# Patient Record
Sex: Male | Born: 1970 | Race: White | Hispanic: No | Smoking: Never smoker
Health system: Southern US, Community
[De-identification: ages and names within clinical notes are randomized; demographics above are authoritative.]

## PROBLEM LIST (undated history)

## (undated) DIAGNOSIS — G43909 Migraine, unspecified, not intractable, without status migrainosus: Secondary | ICD-10-CM

## (undated) DIAGNOSIS — Z9889 Other specified postprocedural states: Secondary | ICD-10-CM

## (undated) DIAGNOSIS — R112 Nausea with vomiting, unspecified: Secondary | ICD-10-CM

## (undated) DIAGNOSIS — I447 Left bundle-branch block, unspecified: Secondary | ICD-10-CM

## (undated) DIAGNOSIS — R200 Anesthesia of skin: Secondary | ICD-10-CM

## (undated) DIAGNOSIS — Z8489 Family history of other specified conditions: Secondary | ICD-10-CM

## (undated) DIAGNOSIS — R011 Cardiac murmur, unspecified: Secondary | ICD-10-CM

## (undated) HISTORY — PX: BACK SURGERY: SHX140

## (undated) HISTORY — PX: REFRACTIVE SURGERY: SHX103

## (undated) HISTORY — PX: FRACTURE SURGERY: SHX138

---
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
## 1988-11-13 HISTORY — PX: CYST REMOVAL NECK: SHX6281

## 1995-11-14 HISTORY — PX: WRIST FRACTURE SURGERY: SHX121

## 2013-05-01 ENCOUNTER — Encounter (HOSPITAL_COMMUNITY): Payer: Self-pay | Admitting: Pharmacy Technician

## 2013-05-05 NOTE — H&P (Signed)
  History of Present Illness The patient is a 41 year old male who presents today for follow up of their neck. The patient is being followed for their left-sided neck pain. They are 8 week(s) out. Symptoms reported today include: pain (some posterior cervical, posterior scapula radaiting into the left upper ext. ), weakness (chest weakness ) and numbness (left upper ext.). The following medication has been used for pain control: none. The patient presents today following referral to ___ (Dr. Franky Macho neuro. with same opinion as Dr. Shon Baton. Patient is questioning levels due to couple of levels on MRI. ).    Subjective Transcription  He returns today for follow up. He has had an opportunity to review all the information that we had discussed at his two previous office visits.    Allergies No Known Drug Allergies. 03/03/2013   Social History Tobacco use. never smoker   Medication History(Lori W Lamb; 04/28/2013 2:13 PM) Aleve ( Oral) Specific dose unknown - Active.   Objective Transcription  On clinical exam his pain has improved but he still continues to have the left triceps weakness which is now about 3+ to 4-. He has neck pain which is slightly improved. No right arm symptoms. No real shoulder, elbow or wrist pain. More pain is coming down into the arm in the nerve distribution of C7 and into the pectoralis. Normal gait pattern. No shortness of breath or chest pain. Abdomen is soft, nontender, normal bowel function. Negative Babinski test. No clonus. Negative Hoffmann sign. Positive Lhermitte's sign on the left side. Positive Spurling's sign on the left side.    RADIOGRAPHS:  MRI was re-reviewed. He has degenerative disease at C5-6 with collapse of the normal disc space confirmed on his plain x-rays and he has a large disc herniation at C6-7.    Assessment & Plan Pain, Cervical (723.1)  Risks of surgery include, but are not limited to: Throat pain,swallowing  difficulty, hoarseness or change in voice, Death, stroke, paralysis, nerve root damage/injury, bleeding, blood clots, loss of bowel/bladder control, hardware failure, or malposition, spinal fluid leak, adjacent segment disease, non-union, need for further surgery, ongoing or worse pain, infection and recurrent disc herniation   Plans Transcription  We had an extensive discussion about surgical management. At this point in time I told him my only concern with addressing the clinically symptomatic disc herniation at C6-7 is that the adjacent segment has already shown signs of degeneration with Modic endplate changes and loss of normal disc height. Therefore the likelihood of adjacent segment disease occurring at the C5-6 level is somewhat higher than normal and could require further surgical intervention in the future. At this point I told him it is also reasonable to do both levels. Even though he is not having C6 radiculopathy I do think the C5-6 collapse disc can be contributing to his neck pain. Therefore the plan will be to do a two level ACDF C5-6, C6-7. Clinical diagnosis is degenerative disc disease C5-6, acute disc herniation C6-7 with C7 radiculopathy. We have reviewed the risks which include infection, bleeding, nerve damage, death, stroke, paralysis, failure to heal, need for further surgery, throat pain, swallowing difficulties, hoarseness in the voice, adjacent segment collapse, nonunion. Because this is a multilevel procedure we will use an external bone stimulator following surgery for anywhere from 6 to 12 months. All of his questions were addressed. He is present for the dictation. We will try and get this done next week.

## 2013-05-05 NOTE — Pre-Procedure Instructions (Signed)
Harold Jacobs  05/05/2013   Your procedure is scheduled on:  Wednesday, May 07, 2013  Report to [facility] (3rd floor) at  6:30 AM.  Call this number if you have problems the morning of surgery: [phone]   Remember:   Do not eat food or drink liquids after midnight.   Take these medicines the morning of surgery with A SIP OF WATER: none Stop taking Aspirin and herbal medications. Do not take any NSAIDs ie: Ibuprofen, Advil, Naproxen or any medication containing Aspirin.   Do not wear jewelry, make-up or nail polish.  Do not wear lotions, powders, or perfumes. You may wear deodorant.  Do not shave 48 hours prior to surgery. Men may shave face and neck.  Do not bring valuables to the hospital.  [facility] is not responsible  for any belongings or valuables.  Contacts, dentures or bridgework may not be worn into surgery.  Leave suitcase in the car. After surgery it may be brought to your room.  For patients admitted to the hospital, checkout time is 11:00 AM the day of discharge.   Patients discharged the day of surgery will not be allowed to drive home.  Name and phone number of your driver:   Special Instructions: Shower using CHG 2 nights before surgery and the night before surgery.  If you shower the day of surgery use CHG.  Use special wash - you have one bottle of CHG for all showers.  You should use approximately 1/3 of the bottle for each shower.   Please read over the following fact sheets that you were given: Pain Booklet, Coughing and Deep Breathing, MRSA Information and Surgical Site Infection Prevention

## 2013-05-06 ENCOUNTER — Encounter (HOSPITAL_COMMUNITY)
Admission: RE | Admit: 2013-05-06 | Discharge: 2013-05-06 | Disposition: A | Discharge status: Home / Self Care | Payer: BC Managed Care – PPO | Source: Ambulatory Visit | Attending: Orthopedic Surgery | Admitting: Orthopedic Surgery

## 2013-05-06 ENCOUNTER — Encounter (HOSPITAL_COMMUNITY): Payer: Self-pay

## 2013-05-06 HISTORY — DX: Anesthesia of skin: R20.0

## 2013-05-06 HISTORY — DX: Other specified postprocedural states: Z98.890

## 2013-05-06 HISTORY — DX: Nausea with vomiting, unspecified: R11.2

## 2013-05-06 LAB — CBC
MCH: 31 pg (ref 26.0–34.0)
MCV: 88.2 fL (ref 78.0–100.0)
Platelets: 248 10*3/uL (ref 150–400)
RDW: 12.4 % (ref 11.5–15.5)

## 2013-05-06 MED ORDER — ACETAMINOPHEN 10 MG/ML IV SOLN
1000.0000 mg | Freq: Once | INTRAVENOUS | Status: AC
  Administered 2013-05-07: 1000 mg via INTRAVENOUS
  Filled 2013-05-06: qty 100

## 2013-05-06 MED ORDER — DEXAMETHASONE SODIUM PHOSPHATE 4 MG/ML IJ SOLN
4.0000 mg | Freq: Once | INTRAMUSCULAR | Status: AC
  Administered 2013-05-07: 4 mg via INTRAVENOUS
  Filled 2013-05-06: qty 1

## 2013-05-06 MED ORDER — CEFAZOLIN SODIUM-DEXTROSE 2-3 GM-% IV SOLR
2.0000 g | INTRAVENOUS | Status: AC
  Administered 2013-05-07: 2 g via INTRAVENOUS
  Filled 2013-05-06: qty 50

## 2013-05-06 NOTE — Progress Notes (Signed)
Pt denies SOB, chest pain, and being under the care of a cardiologist. Pt made aware to bring brace on DOS.

## 2013-05-07 ENCOUNTER — Observation Stay (HOSPITAL_COMMUNITY): Payer: BC Managed Care – PPO

## 2013-05-07 ENCOUNTER — Ambulatory Visit (HOSPITAL_COMMUNITY): Payer: BC Managed Care – PPO

## 2013-05-07 ENCOUNTER — Ambulatory Visit (HOSPITAL_COMMUNITY): Payer: BC Managed Care – PPO | Admitting: Anesthesiology

## 2013-05-07 ENCOUNTER — Encounter (HOSPITAL_COMMUNITY): Payer: Self-pay | Admitting: *Deleted

## 2013-05-07 ENCOUNTER — Observation Stay (HOSPITAL_COMMUNITY)
Admission: RE | Admit: 2013-05-07 | Discharge: 2013-05-08 | Disposition: A | Discharge status: Home / Self Care | Payer: BC Managed Care – PPO | Source: Ambulatory Visit | Attending: Orthopedic Surgery | Admitting: Orthopedic Surgery

## 2013-05-07 ENCOUNTER — Encounter (HOSPITAL_COMMUNITY)
Admission: RE | Disposition: A | Discharge status: Home / Self Care | Payer: Self-pay | Source: Ambulatory Visit | Attending: Orthopedic Surgery

## 2013-05-07 ENCOUNTER — Encounter (HOSPITAL_COMMUNITY): Payer: Self-pay | Admitting: Anesthesiology

## 2013-05-07 DIAGNOSIS — Z01812 Encounter for preprocedural laboratory examination: Secondary | ICD-10-CM | POA: Insufficient documentation

## 2013-05-07 DIAGNOSIS — M5030 Other cervical disc degeneration, unspecified cervical region: Secondary | ICD-10-CM | POA: Insufficient documentation

## 2013-05-07 DIAGNOSIS — M47812 Spondylosis without myelopathy or radiculopathy, cervical region: Secondary | ICD-10-CM | POA: Insufficient documentation

## 2013-05-07 DIAGNOSIS — M5020 Other cervical disc displacement, unspecified cervical region: Principal | ICD-10-CM | POA: Insufficient documentation

## 2013-05-07 HISTORY — DX: Migraine, unspecified, not intractable, without status migrainosus: G43.909

## 2013-05-07 HISTORY — DX: Family history of other specified conditions: Z84.89

## 2013-05-07 HISTORY — PX: ANTERIOR CERVICAL DECOMP/DISCECTOMY FUSION: SHX1161

## 2013-05-07 SURGERY — ANTERIOR CERVICAL DECOMPRESSION/DISCECTOMY FUSION 2 LEVELS
Anesthesia: General | Site: Spine Cervical | Wound class: Clean

## 2013-05-07 MED ORDER — NEOSTIGMINE METHYLSULFATE 1 MG/ML IJ SOLN
INTRAMUSCULAR | Status: DC | PRN
  Administered 2013-05-07: 4 mg via INTRAVENOUS

## 2013-05-07 MED ORDER — PHENYLEPHRINE HCL 10 MG/ML IJ SOLN
INTRAMUSCULAR | Status: DC | PRN
  Administered 2013-05-07: 40 ug via INTRAVENOUS
  Administered 2013-05-07: 80 ug via INTRAVENOUS
  Administered 2013-05-07 (×3): 40 ug via INTRAVENOUS

## 2013-05-07 MED ORDER — LIDOCAINE HCL (CARDIAC) 20 MG/ML IV SOLN
INTRAVENOUS | Status: DC | PRN
  Administered 2013-05-07: 80 mg via INTRAVENOUS

## 2013-05-07 MED ORDER — SODIUM CHLORIDE 0.9 % IV SOLN
INTRAVENOUS | Status: DC | PRN
  Administered 2013-05-07: 09:00:00 via INTRAVENOUS

## 2013-05-07 MED ORDER — DEXAMETHASONE 4 MG PO TABS
4.0000 mg | ORAL_TABLET | Freq: Four times a day (QID) | ORAL | Status: DC
  Administered 2013-05-08: 4 mg via ORAL
  Filled 2013-05-07 (×7): qty 1

## 2013-05-07 MED ORDER — CEFAZOLIN SODIUM 1-5 GM-% IV SOLN
1.0000 g | Freq: Three times a day (TID) | INTRAVENOUS | Status: AC
  Administered 2013-05-07 – 2013-05-08 (×2): 1 g via INTRAVENOUS
  Filled 2013-05-07 (×3): qty 50

## 2013-05-07 MED ORDER — PHENOL 1.4 % MT LIQD
1.0000 | OROMUCOSAL | Status: DC | PRN
Start: 2013-05-07 — End: 2013-05-08

## 2013-05-07 MED ORDER — MENTHOL 3 MG MT LOZG: 1.0000 | LOZENGE | OROMUCOSAL | Status: DC | PRN

## 2013-05-07 MED ORDER — EPHEDRINE SULFATE 50 MG/ML IJ SOLN
INTRAMUSCULAR | Status: DC | PRN
  Administered 2013-05-07: 10 mg via INTRAVENOUS

## 2013-05-07 MED ORDER — ACETAMINOPHEN 10 MG/ML IV SOLN
1000.0000 mg | Freq: Four times a day (QID) | INTRAVENOUS | Status: AC
  Administered 2013-05-07 – 2013-05-08 (×4): 1000 mg via INTRAVENOUS
  Filled 2013-05-07 (×5): qty 100

## 2013-05-07 MED ORDER — POLYETHYLENE GLYCOL 3350 17 GM/SCOOP PO POWD: 17.0000 g | Freq: Every day | ORAL | Status: DC

## 2013-05-07 MED ORDER — GLYCOPYRROLATE 0.2 MG/ML IJ SOLN
INTRAMUSCULAR | Status: DC | PRN
  Administered 2013-05-07: 0.6 mg via INTRAVENOUS

## 2013-05-07 MED ORDER — ROCURONIUM BROMIDE 100 MG/10ML IV SOLN
INTRAVENOUS | Status: DC | PRN
  Administered 2013-05-07: 10 mg via INTRAVENOUS
  Administered 2013-05-07: 20 mg via INTRAVENOUS
  Administered 2013-05-07: 50 mg via INTRAVENOUS
  Administered 2013-05-07: 30 mg via INTRAVENOUS

## 2013-05-07 MED ORDER — BUPIVACAINE-EPINEPHRINE PF 0.25-1:200000 % IJ SOLN
INTRAMUSCULAR | Status: AC
  Filled 2013-05-07: qty 30

## 2013-05-07 MED ORDER — THROMBIN 20000 UNITS EX SOLR
CUTANEOUS | Status: DC | PRN
  Administered 2013-05-07: 09:00:00 via TOPICAL

## 2013-05-07 MED ORDER — OXYCODONE HCL 5 MG PO TABS: 5.0000 mg | ORAL_TABLET | Freq: Once | ORAL | Status: DC | PRN

## 2013-05-07 MED ORDER — OXYCODONE HCL 5 MG PO TABS
ORAL_TABLET | ORAL | Status: AC
  Filled 2013-05-07: qty 1

## 2013-05-07 MED ORDER — BUPIVACAINE-EPINEPHRINE 0.25% -1:200000 IJ SOLN
INTRAMUSCULAR | Status: DC | PRN
  Administered 2013-05-07: 2 mL

## 2013-05-07 MED ORDER — ONDANSETRON HCL 4 MG/2ML IJ SOLN
INTRAMUSCULAR | Status: DC | PRN
  Administered 2013-05-07: 4 mg via INTRAVENOUS

## 2013-05-07 MED ORDER — THROMBIN 20000 UNITS EX SOLR
CUTANEOUS | Status: AC
  Filled 2013-05-07: qty 20000

## 2013-05-07 MED ORDER — ARTIFICIAL TEARS OP OINT
TOPICAL_OINTMENT | OPHTHALMIC | Status: DC | PRN
  Administered 2013-05-07: 1 via OPHTHALMIC

## 2013-05-07 MED ORDER — DEXTROSE 5 % IV SOLN
500.0000 mg | Freq: Four times a day (QID) | INTRAVENOUS | Status: DC | PRN
  Administered 2013-05-07: 500 mg via INTRAVENOUS
  Filled 2013-05-07: qty 5

## 2013-05-07 MED ORDER — METHOCARBAMOL 500 MG PO TABS
ORAL_TABLET | ORAL | Status: AC
  Filled 2013-05-07: qty 1

## 2013-05-07 MED ORDER — SODIUM CHLORIDE 0.9 % IJ SOLN: 3.0000 mL | INTRAMUSCULAR | Status: DC | PRN

## 2013-05-07 MED ORDER — OXYCODONE HCL 5 MG/5ML PO SOLN: 5.0000 mg | Freq: Once | ORAL | Status: DC | PRN

## 2013-05-07 MED ORDER — DEXAMETHASONE SODIUM PHOSPHATE 4 MG/ML IJ SOLN
4.0000 mg | Freq: Four times a day (QID) | INTRAMUSCULAR | Status: DC
  Administered 2013-05-07 – 2013-05-08 (×3): 4 mg via INTRAVENOUS
  Filled 2013-05-07 (×7): qty 1

## 2013-05-07 MED ORDER — ZOLPIDEM TARTRATE 5 MG PO TABS: 5.0000 mg | ORAL_TABLET | Freq: Every evening | ORAL | Status: DC | PRN

## 2013-05-07 MED ORDER — PROMETHAZINE HCL 25 MG/ML IJ SOLN: 6.2500 mg | INTRAMUSCULAR | Status: DC | PRN

## 2013-05-07 MED ORDER — HYDROMORPHONE HCL PF 1 MG/ML IJ SOLN
0.2500 mg | INTRAMUSCULAR | Status: DC | PRN
  Administered 2013-05-07 (×2): 0.5 mg via INTRAVENOUS

## 2013-05-07 MED ORDER — FENTANYL CITRATE 0.05 MG/ML IJ SOLN
INTRAMUSCULAR | Status: DC | PRN
  Administered 2013-05-07 (×2): 100 ug via INTRAVENOUS
  Administered 2013-05-07: 50 ug via INTRAVENOUS

## 2013-05-07 MED ORDER — SODIUM CHLORIDE 0.9 % IJ SOLN
3.0000 mL | Freq: Two times a day (BID) | INTRAMUSCULAR | Status: DC
  Administered 2013-05-08: 3 mL via INTRAVENOUS

## 2013-05-07 MED ORDER — OXYCODONE HCL 5 MG PO TABS
10.0000 mg | ORAL_TABLET | ORAL | Status: DC | PRN
  Administered 2013-05-07 – 2013-05-08 (×4): 10 mg via ORAL
  Filled 2013-05-07 (×5): qty 2

## 2013-05-07 MED ORDER — METHOCARBAMOL 500 MG PO TABS: 500.0000 mg | ORAL_TABLET | Freq: Three times a day (TID) | ORAL | Status: DC | PRN

## 2013-05-07 MED ORDER — MIDAZOLAM HCL 2 MG/2ML IJ SOLN: 0.5000 mg | Freq: Once | INTRAMUSCULAR | Status: DC | PRN

## 2013-05-07 MED ORDER — METHOCARBAMOL 500 MG PO TABS: 500.0000 mg | ORAL_TABLET | Freq: Four times a day (QID) | ORAL | Status: DC | PRN

## 2013-05-07 MED ORDER — 0.9 % SODIUM CHLORIDE (POUR BTL) OPTIME
TOPICAL | Status: DC | PRN
  Administered 2013-05-07: 1000 mL

## 2013-05-07 MED ORDER — DOCUSATE SODIUM 100 MG PO CAPS: 100.0000 mg | ORAL_CAPSULE | Freq: Three times a day (TID) | ORAL | Status: DC | PRN

## 2013-05-07 MED ORDER — MORPHINE SULFATE 2 MG/ML IJ SOLN: 1.0000 mg | INTRAMUSCULAR | Status: DC | PRN

## 2013-05-07 MED ORDER — ONDANSETRON HCL 4 MG/2ML IJ SOLN
4.0000 mg | INTRAMUSCULAR | Status: DC | PRN
  Administered 2013-05-07: 4 mg via INTRAVENOUS
  Filled 2013-05-07: qty 2

## 2013-05-07 MED ORDER — HYDROMORPHONE HCL PF 1 MG/ML IJ SOLN
INTRAMUSCULAR | Status: AC
  Administered 2013-05-07: 0.5 mg via INTRAVENOUS
  Filled 2013-05-07: qty 1

## 2013-05-07 MED ORDER — SODIUM CHLORIDE 0.9 % IV SOLN: 250.0000 mL | INTRAVENOUS | Status: DC

## 2013-05-07 MED ORDER — LACTATED RINGERS IV SOLN
INTRAVENOUS | Status: DC | PRN
  Administered 2013-05-07 (×3): via INTRAVENOUS

## 2013-05-07 MED ORDER — SCOPOLAMINE 1 MG/3DAYS TD PT72: 1.0000 | MEDICATED_PATCH | Freq: Once | TRANSDERMAL | Status: DC

## 2013-05-07 MED ORDER — DEXTROSE 5 % IV SOLN
INTRAVENOUS | Status: DC | PRN
  Administered 2013-05-07: 09:00:00 via INTRAVENOUS

## 2013-05-07 MED ORDER — MIDAZOLAM HCL 5 MG/5ML IJ SOLN
INTRAMUSCULAR | Status: DC | PRN
  Administered 2013-05-07 (×2): 1 mg via INTRAVENOUS

## 2013-05-07 MED ORDER — ONDANSETRON HCL 4 MG PO TABS: 4.0000 mg | ORAL_TABLET | Freq: Three times a day (TID) | ORAL | Status: DC | PRN

## 2013-05-07 MED ORDER — OXYCODONE-ACETAMINOPHEN 10-325 MG PO TABS: 1.0000 | ORAL_TABLET | ORAL | Status: DC | PRN

## 2013-05-07 MED ORDER — HEMOSTATIC AGENTS (NO CHARGE) OPTIME
TOPICAL | Status: DC | PRN
  Administered 2013-05-07: 1 via TOPICAL

## 2013-05-07 MED ORDER — SCOPOLAMINE 1 MG/3DAYS TD PT72
MEDICATED_PATCH | TRANSDERMAL | Status: DC | PRN
  Administered 2013-05-07: 1 via TRANSDERMAL

## 2013-05-07 MED ORDER — PROPOFOL 10 MG/ML IV BOLUS
INTRAVENOUS | Status: DC | PRN
  Administered 2013-05-07: 10 mg via INTRAVENOUS
  Administered 2013-05-07: 200 mg via INTRAVENOUS
  Administered 2013-05-07: 50 mg via INTRAVENOUS

## 2013-05-07 MED ORDER — LACTATED RINGERS IV SOLN
INTRAVENOUS | Status: DC
  Administered 2013-05-07 – 2013-05-08 (×2): via INTRAVENOUS

## 2013-05-07 SURGICAL SUPPLY — 59 items
BLADE SURG ROTATE 9660 (MISCELLANEOUS) IMPLANT
BUR EGG ELITE 4.0 (BURR) IMPLANT
BUR MATCHSTICK NEURO 3.0 LAGG (BURR) IMPLANT
CANISTER SUCTION 2500CC (MISCELLANEOUS) ×2 IMPLANT
CLOTH BEACON ORANGE TIMEOUT ST (SAFETY) ×2 IMPLANT
CLSR STERI-STRIP ANTIMIC 1/2X4 (GAUZE/BANDAGES/DRESSINGS) ×2 IMPLANT
CORDS BIPOLAR (ELECTRODE) ×2 IMPLANT
COVER SURGICAL LIGHT HANDLE (MISCELLANEOUS) ×4 IMPLANT
CRADLE DONUT ADULT HEAD (MISCELLANEOUS) ×2 IMPLANT
DEVICE ENDSKLTN TC MED 8MM (Orthopedic Implant) ×1 IMPLANT
DRAPE C-ARM 42X72 X-RAY (DRAPES) ×2 IMPLANT
DRAPE POUCH INSTRU U-SHP 10X18 (DRAPES) ×2 IMPLANT
DRAPE SURG 17X23 STRL (DRAPES) ×2 IMPLANT
DRAPE U-SHAPE 47X51 STRL (DRAPES) ×2 IMPLANT
DRSG MEPILEX BORDER 4X4 (GAUZE/BANDAGES/DRESSINGS) ×2 IMPLANT
DURAPREP 26ML APPLICATOR (WOUND CARE) ×2 IMPLANT
ELECT COATED BLADE 2.86 ST (ELECTRODE) ×2 IMPLANT
ELECT REM PT RETURN 9FT ADLT (ELECTROSURGICAL) ×2
ELECTRODE REM PT RTRN 9FT ADLT (ELECTROSURGICAL) ×1 IMPLANT
ENDOSKELETON LG TC 6VBR 8MM (Orthopedic Implant) ×2 IMPLANT
ENDOSKELTON TC IMPLANT 8MM MED (Orthopedic Implant) ×2 IMPLANT
GLOVE BIOGEL PI IND STRL 8.5 (GLOVE) ×1 IMPLANT
GLOVE BIOGEL PI INDICATOR 8.5 (GLOVE) ×1
GLOVE ECLIPSE 8.5 STRL (GLOVE) ×2 IMPLANT
GOWN PREVENTION PLUS XXLARGE (GOWN DISPOSABLE) ×2 IMPLANT
GOWN STRL REIN XL XLG (GOWN DISPOSABLE) ×4 IMPLANT
KIT BASIN OR (CUSTOM PROCEDURE TRAY) ×2 IMPLANT
KIT ROOM TURNOVER OR (KITS) ×2 IMPLANT
NEEDLE SPNL 18GX3.5 QUINCKE PK (NEEDLE) ×2 IMPLANT
NS IRRIG 1000ML POUR BTL (IV SOLUTION) ×2 IMPLANT
PACK ORTHO CERVICAL (CUSTOM PROCEDURE TRAY) ×2 IMPLANT
PACK UNIVERSAL I (CUSTOM PROCEDURE TRAY) ×2 IMPLANT
PAD ARMBOARD 7.5X6 YLW CONV (MISCELLANEOUS) ×4 IMPLANT
PATTIES SURGICAL .25X.25 (GAUZE/BANDAGES/DRESSINGS) IMPLANT
PIN DISTRACTION 12MM (PIN) ×1
PIN DSTRCT 12XNS SS ACIS (PIN) ×1 IMPLANT
PIN RETAINER PRODISC 14 MM (PIN) ×2 IMPLANT
PIN TEMP SKYLINE THREADED (PIN) ×4 IMPLANT
PLATE SKYLINE 2 LEVEL 34MM (Plate) ×2 IMPLANT
PUTTY BONE DBX 5CC MIX (Putty) ×2 IMPLANT
RESTRAINT LIMB HOLDER UNIV (RESTRAINTS) ×2 IMPLANT
SCREW SKYLINE 14MM SD-VA (Screw) ×10 IMPLANT
SCREW VAR SELF TAP SKYLINE 14M (Screw) ×2 IMPLANT
SPONGE INTESTINAL PEANUT (DISPOSABLE) ×2 IMPLANT
SPONGE LAP 4X18 X RAY DECT (DISPOSABLE) ×4 IMPLANT
SPONGE SURGIFOAM ABS GEL 100 (HEMOSTASIS) ×2 IMPLANT
SURGIFLO TRUKIT (HEMOSTASIS) ×2 IMPLANT
SUT MNCRL AB 3-0 PS2 18 (SUTURE) ×2 IMPLANT
SUT SILK 2 0 (SUTURE)
SUT SILK 2-0 18XBRD TIE 12 (SUTURE) IMPLANT
SUT VIC AB 2-0 CT1 18 (SUTURE) ×2 IMPLANT
SYR BULB IRRIGATION 50ML (SYRINGE) ×2 IMPLANT
SYR CONTROL 10ML LL (SYRINGE) ×2 IMPLANT
TAPE CLOTH 4X10 WHT NS (GAUZE/BANDAGES/DRESSINGS) ×2 IMPLANT
TAPE UMBILICAL COTTON 1/8X30 (MISCELLANEOUS) ×2 IMPLANT
TOWEL OR 17X24 6PK STRL BLUE (TOWEL DISPOSABLE) ×2 IMPLANT
TOWEL OR 17X26 10 PK STRL BLUE (TOWEL DISPOSABLE) ×2 IMPLANT
TRAY FOLEY CATH 14FR (SET/KITS/TRAYS/PACK) IMPLANT
WATER STERILE IRR 1000ML POUR (IV SOLUTION) ×2 IMPLANT

## 2013-05-07 NOTE — Anesthesia Preprocedure Evaluation (Addendum)
Anesthesia Evaluation  Patient identified by MRN, date of birth, ID band Patient awake    Reviewed: Allergy & Precautions, H&P , NPO status , Patient's Chart, lab work & pertinent test results, reviewed documented beta blocker date and time   History of Anesthesia Complications (+) PONV  Airway Mallampati: I TM Distance: >3 FB Neck ROM: Full    Dental  (+) Teeth Intact and Dental Advisory Given   Pulmonary  breath sounds clear to auscultation        Cardiovascular negative cardio ROS  Rhythm:Regular Rate:Normal     Neuro/Psych    GI/Hepatic negative GI ROS, Neg liver ROS,   Endo/Other  negative endocrine ROS  Renal/GU negative Renal ROS     Musculoskeletal   Abdominal   Peds  Hematology negative hematology ROS (+)   Anesthesia Other Findings   Reproductive/Obstetrics                        Anesthesia Physical Anesthesia Plan  ASA: I  Anesthesia Plan: General   Post-op Pain Management:    Induction: Intravenous  Airway Management Planned: Oral ETT  Additional Equipment:   Intra-op Plan:   Post-operative Plan: Extubation in OR  Informed Consent: I have reviewed the patients History and Physical, chart, labs and discussed the procedure including the risks, benefits and alternatives for the proposed anesthesia with the patient or authorized representative who has indicated his/her understanding and acceptance.   Dental advisory given  Plan Discussed with: CRNA and Surgeon  Anesthesia Plan Comments:        Anesthesia Quick Evaluation

## 2013-05-07 NOTE — Preoperative (Signed)
Beta Blockers   Reason not to administer Beta Blockers:Not Applicable 

## 2013-05-07 NOTE — Transfer of Care (Signed)
Immediate Anesthesia Transfer of Care Note  Patient: Kolden Dupee  Procedure(s) Performed: Procedure(s) with comments: ANTERIOR CERVICAL DISCECTOMY FUSION   (ACDF C5-7 ) (2 LEVELS)  (N/A) - cervical five-six, six-seven anterior cervical discectomy and fusion  Patient Location: PACU  Anesthesia Type:General  Level of Consciousness: awake, alert , oriented and patient cooperative  Airway & Oxygen Therapy: Patient Spontanous Breathing and Patient connected to nasal cannula oxygen  Post-op Assessment: Report given to PACU RN and Post -op Vital signs reviewed and stable  Post vital signs: Reviewed and stable  Complications: No apparent anesthesia complications

## 2013-05-07 NOTE — Brief Op Note (Signed)
05/07/2013  11:35 AM  PATIENT:  Esmeralda Links  42 y.o. male  PRE-OPERATIVE DIAGNOSIS:  CERVICAL DISC HERNIATION   POST-OPERATIVE DIAGNOSIS:  CERVICAL DISC HERNIATION   PROCEDURE:  Procedure(s) with comments: ANTERIOR CERVICAL DISCECTOMY FUSION   (ACDF C5-7 ) (2 LEVELS)  (N/A) - cervical five-six, six-seven anterior cervical discectomy and fusion  SURGEON:  Surgeon(s) and Role:    * Venita Lick, MD - Primary  PHYSICIAN ASSISTANT:   ASSISTANTS: none   ANESTHESIA:   general  EBL:  Total I/O In: 2150 [I.V.:2150] Out: 10 [Blood:10]  BLOOD ADMINISTERED:none  DRAINS: none   LOCAL MEDICATIONS USED:  MARCAINE     SPECIMEN:  No Specimen  DISPOSITION OF SPECIMEN:  N/A  COUNTS:  YES  TOURNIQUET:  * No tourniquets in log *  DICTATION: .Other Dictation: Dictation Number [id number]  PLAN OF CARE: Admit for overnight observation  PATIENT DISPOSITION:  PACU - hemodynamically stable.

## 2013-05-07 NOTE — Plan of Care (Signed)
Problem: Consults Goal: Diagnosis - Spinal Surgery Cervical Spine Fusion ACDF C5-7

## 2013-05-07 NOTE — Evaluation (Signed)
Physical Therapy Evaluation Patient Details Name: Harold Jacobs MRN: [id number] DOB: [birth year] Today's Date: 05/07/2013 Time: 7829-5621 PT Time Calculation (min): 18 min  PT Assessment / Plan / Recommendation History of Present Illness  pt s/p anterior cervical diskectomy and fusion C5-C7  Clinical Impression  Patient is s/p ant cervical diskectomy and fusion C5-C7 POD#0.  surgery resulting in the deficits listed below (see PT Problem List). Patient will benefit from skilled PT to increase their independence and safety with mobility (while adhering to their precautions) to allow discharge home. Anticipate pt to be ready from D/C home from mobility standpoint after PT session in the morning to address stair amb.     PT Assessment  Patient needs continued PT services    Follow Up Recommendations  Supervision - Intermittent;Supervision for mobility/OOB    Does the patient have the potential to tolerate intense rehabilitation      Barriers to Discharge        Equipment Recommendations  None recommended by PT    Recommendations for Other Services OT consult   Frequency Min 5X/week    Precautions / Restrictions Precautions Precautions: Cervical Precaution Comments: pt given handout on cervical neck precautions Required Braces or Orthoses: Cervical Brace Cervical Brace: Hard collar;At all times Restrictions Weight Bearing Restrictions: No   Pertinent Vitals/Pain C/o pain with swallowing; did not rate. RN notified.       Mobility  Bed Mobility Bed Mobility: Rolling Left;Left Sidelying to Sit Rolling Left: 4: Min guard;With rail Left Sidelying to Sit: 5: Supervision Details for Bed Mobility Assistance: (A) to facilitate log rolling technique for bed mobility; verbal cues for log rolling technique; required increased time to complete due to pain  Transfers Transfers: Sit to Stand;Stand to Sit Sit to Stand: 4: Min guard;From bed Stand to Sit: 5: Supervision;To  chair/3-in-1;With armrests Details for Transfer Assistance: min guard initally with sit to stand due to dizziness; pt required mod cues to maintain cervical neck precautions  Ambulation/Gait Ambulation/Gait Assistance: 5: Supervision Ambulation Distance (Feet): 12 Feet Assistive device: Other (Comment) (IV pole) Ambulation/Gait Assistance Details: limited by dizziniess Gait Pattern: Step-through pattern;Narrow base of support Gait velocity: decr due to pain and dizziness  Stairs: No Wheelchair Mobility Wheelchair Mobility: No    Exercises     PT Diagnosis: Difficulty walking;Acute pain  PT Problem List: Decreased mobility;Pain;Decreased knowledge of precautions PT Treatment Interventions: Gait training;Stair training;Functional mobility training;Therapeutic activities;Neuromuscular re-education;Patient/family education;Balance training     PT Goals(Current goals can be found in the care plan section) Acute Rehab PT Goals Patient Stated Goal: home as soon as i can  PT Goal Formulation: With patient Time For Goal Achievement: 05/09/13 Potential to Achieve Goals: Good  Visit Information  Last PT Received On: 05/07/13 Assistance Needed: +1 History of Present Illness: pt s/p anterior cervical diskectomy and fusion C5-C7       Prior Functioning  Home Living Family/patient expects to be discharged to:: Private residence Living Arrangements: Alone Available Help at Discharge: Friend(s);Available 24 hours/day Type of Home: House Home Access: Stairs to enter [employer] of Steps: 2 Entrance Stairs-Rails: None Home Layout: Two level Alternate Level Stairs-Number of Steps: 12-15 Alternate Level Stairs-Rails: Left Home Equipment: None Prior Function Level of Independence: Independent Communication Communication: No difficulties Dominant Hand: Right    Cognition  Cognition Arousal/Alertness: Awake/alert Behavior During Therapy: WFL for tasks  assessed/performed Overall Cognitive Status: Within Functional Limits for tasks assessed    Extremity/Trunk Assessment Upper Extremity Assessment Upper Extremity Assessment: Overall WFL for  tasks assessed Lower Extremity Assessment Lower Extremity Assessment: Overall WFL for tasks assessed Cervical / Trunk Assessment Cervical / Trunk Assessment: Normal   Balance Balance Balance Assessed: Yes Static Sitting Balance Static Sitting - Balance Support: Bilateral upper extremity supported;Feet supported Static Sitting - Level of Assistance: 5: Stand by assistance Static Sitting - Comment/# of Minutes: pt tolerated sitting EOB ~4 min while dizziness subsided   End of Session PT - End of Session Equipment Utilized During Treatment: Gait belt;Cervical collar Activity Tolerance: Patient tolerated treatment well Patient left: in chair;with bed alarm set;with family/visitor present Nurse Communication: Mobility status  GP Functional Assessment Tool Used: clinical judgement Functional Limitation: Mobility: Walking and moving around Mobility: Walking and Moving Around Current Status [phone]): At least 1 percent but less than 20 percent impaired, limited or restricted Mobility: Walking and Moving Around Goal Status [phone]): 0 percent impaired, limited or restricted   Donell Sievert, [name] [phone] 05/07/2013, 3:01 PM

## 2013-05-07 NOTE — Anesthesia Procedure Notes (Signed)
Procedure Name: Intubation Date/Time: 05/07/2013 8:38 AM Performed by: Marni Griffon Pre-anesthesia Checklist: Patient identified, Emergency Drugs available, Suction available and Patient being monitored Patient Re-evaluated:Patient Re-evaluated prior to inductionOxygen Delivery Method: Circle system utilized Preoxygenation: Pre-oxygenation with 100% oxygen Intubation Type: IV induction Ventilation: Mask ventilation without difficulty Laryngoscope Size: Mac and 3 (needed the #4) Grade View: Grade II Tube type: Oral Tube size: 7.5 mm Number of attempts: 1 Airway Equipment and Method: Stylet Placement Confirmation: ETT inserted through vocal cords under direct vision,  breath sounds checked- equal and bilateral and positive ETCO2 Secured at: 21 (cm at teeth) cm Tube secured with: Tape Dental Injury: Teeth and Oropharynx as per pre-operative assessment

## 2013-05-07 NOTE — Anesthesia Postprocedure Evaluation (Signed)
  Anesthesia Post-op Note  Patient: Harold Jacobs  Procedure(s) Performed: Procedure(s) with comments: ANTERIOR CERVICAL DISCECTOMY FUSION   (ACDF C5-7 ) (2 LEVELS)  (N/A) - cervical five-six, six-seven anterior cervical discectomy and fusion  Patient Location: PACU  Anesthesia Type:General  Level of Consciousness: awake and alert   Airway and Oxygen Therapy: Patient Spontanous Breathing  Post-op Pain: mild  Post-op Assessment: Post-op Vital signs reviewed  Post-op Vital Signs: stable  Complications: No apparent anesthesia complications

## 2013-05-07 NOTE — H&P (Signed)
No change in clinical exam H+P reviewed  

## 2013-05-07 NOTE — Op Note (Signed)
NAMECHAROD, SLAWINSKI NO.:  [id number]  MEDICAL RECORD NO.:  [id number]  LOCATION:  [facility]                         FACILITY:  [facility]  PHYSICIAN:  Alvy Beal, MD    DATE OF BIRTH:  [birth year]  DATE OF PROCEDURE:  05/07/2013 DATE OF DISCHARGE:                              OPERATIVE REPORT   PREOPERATIVE DIAGNOSIS:  Cervical disk herniation, C6-7 and spondylitic cervical degenerative disk disease, C5-6.  POSTOPERATIVE DIAGNOSIS:  Cervical disk herniation, C6-7 and spondylitic cervical degenerative disk disease, C5-6.  OPERATIVE PROCEDURES:  Anterior cervical diskectomy and fusion C5-6, 6- 7.  Instrumentation system used was the [employer] anterior cervical plate 34 mm in length fixed with all 14 mm screws and Titan titanium intervertebral cages.  At C5-6, we used a medium 8 lordotic cage.  At C6- 7, we used 8 large lordotic cage.  Both were packed with DBX mix.  HISTORY:  This is a very pleasant gentleman who has been having significant neck and radicular left arm pain.  Clinical examination confirms C7 radiculopathy.  MRI confirmed a very large disk herniation posterolateral to the left C6-7 with degenerative changes at C5-6. Because of the potential risk of adjacent segment degeneration, the patient elected to proceed with a 2-level procedure.  All appropriate risks, benefits, and alternatives were discussed with the patient and consent was obtained.  INTRAOPERATIVE FINDINGS:  Large disk fragment removed at C6-7 consistent with MRI imaging studies.  OPERATIVE NOTE:  The patient was brought to the operating room and placed supine on the operating table.  After successful induction of general anesthesia and endotracheal intubation, TEDs and SCDs were applied.  Rolled towels were placed underneath the shoulder blade and restraints were placed on the wrist for intraoperative traction for imaging.  The neck was then prepped and draped in a standard  fashion. Time-out was done confirming patient, procedure, and all other pertinent important data.  Once this was completed, a transverse incision was made, centered over the C6 vertebral body.  Sharp dissection carried out down to the platysma.  The platysma was sharply incised in line.  I then sharply dissected along the medial border of the sternocleidomastoid dissecting sharply through the deep cervical and prevertebral fascia.  I then identified the omohyoid and swept and released it from its sling, and swept it medially.  A self-retaining retractor was placed and retracted the esophagus and trachea to the right and identified the carotid sheath on the left side and protected it with a finger.  I then used [employer] scheme manager to remove the remaining prevertebral fascia to completely expose the anterior longitudinal ligament.  I then placed a needle into the C5-6 disk space, took an intraoperative x-ray and confirmed that I was at the appropriate level.  Once this was done, using bipolar electrocautery, I mobilized the longus coli muscles from the midbody of C5 to the midbody of C7.  This was done bilaterally. Once I had this done, self-retaining retractor was placed underneath the longus coli muscle.  I deflated endotracheal cuff, expanded the retractors to the appropriate width and reinflated the endotracheal cuff.  I then placed distraction pins into the bodies of C6 and C7 and  gently distracted the disk space.  An annulotomy was performed with a 15 blade scalpel.  Then, using a combination of pituitary rongeurs, curettes, and Kerrison rongeurs, I removed the majority of the disk material.  Once I was down to the posterior longitudinal ligament and posterior anulus, I used a 1 mm Kerrison to debride this and remove it.  I then began removing with a nerve hook fragments of disk material in the posterior lateral left gutter.  Once I had the bulk of it removed, I then developed a  plane underneath the posterior longitudinal ligament and resected the posterior longitudinal ligament with a 1 mm Kerrison.  This allowed me to visualize in the midline the thecal sac.  I then swept out to the left and I removed 2 more large fragments of disk material.  At this point, I could freely pass my nerve hook underneath the vertebral body of C7 and C6 on that left side and I could visualize the thecal sac re- expanding to occupy the space that I had created by removing the disk fragment.  At this point, I was satisfied that I had performed a thorough diskectomy and I removed all the large fragmented disk herniation visualized on the preoperative MRI.  I rasped the endplates and then placed an 8 large Titan titanium cage packed with DBX mix and this got excellent fixation.  I then repositioned the distraction pins into the body of C5 and C6, repositioned the retractors and then using the same technique, I performed a thorough diskectomy at C5-6.  I again released the posterior anulus and used a fine nerve hook to develop a plane underneath the posterior longitudinal ligament and resected the posterior longitudinal ligament.  I removed all the majority of the uncovertebral joint spurs as well.  I rasped the endplates similar to the C6-7.  I pierced the endplates with a curette in order to have stimulated bleeding.  I then measured with trial devices and placed the 8 medium Titan titanium spacer packed with DBX mix.  At this point, I irrigated the wound copiously with normal saline.  I had both grafts properly fitted with good fixation.  I then removed the distraction pins, and the retractor and then contoured an anterior cervical plate. It was then affixed with 14-mm self-drilling screws into the bodies of C5, C6, and C7.  All screws had excellent purchase.  I then locked the screws according manufacturer's standards.  Final x-rays were taken which were satisfactory.  Hardware and  graft were in good position.  At this point, I irrigated the wound copiously with normal saline and used bipolar electrocautery to obtain hemostasis.  Once I confirmed hemostasis, I then returned the trachea and esophagus to midline, closed the platysma with interrupted 2-0 Vicryl sutures and a 3-0 Monocryl for the skin.  Steri-Strips and a dry dressing were applied.  The patient was then extubated and transferred to PACU without incident.  At the end of the case, all needle and sponge counts were correct.  There was no adverse intraoperative events.     Alvy Beal, MD     DDB/MEDQ  D:  05/07/2013  T:  05/07/2013  Job:  [id number]

## 2013-05-08 NOTE — Progress Notes (Signed)
UR COMPLETED  

## 2013-05-08 NOTE — Discharge Summary (Signed)
Patient ID: Harold Jacobs MRN: [id number] DOB/AGE: [birth year] 42 y.o.  Admit date: 05/07/2013 Discharge date: 05/08/2013  Admission Diagnoses:  Active Problems:   * No active hospital problems. *   Discharge Diagnoses:  Active Problems:   * No active hospital problems. *  status post Procedure(s): ANTERIOR CERVICAL DISCECTOMY FUSION   (ACDF C5-7 ) (2 LEVELS)   Past Medical History  Diagnosis Date  . Numbness     Hx: of left arm/hand  . PONV (postoperative nausea and vomiting)   . Family history of anesthesia complication     "Mom, PONV" (05/07/2013)  . Migraine     "none in many years" (05/07/2013)    Surgeries: Procedure(s): ANTERIOR CERVICAL DISCECTOMY FUSION   (ACDF C5-7 ) (2 LEVELS)  on 05/07/2013   Consultants:  none  Discharged Condition: Improved  Hospital Course: Harold Jacobs is an 42 y.o. male who was admitted 05/07/2013 for operative treatment of <principal problem not specified>. Patient failed conservative treatments (please see the history and physical for the specifics) and had severe unremitting pain that affects sleep, daily activities and work/hobbies. After pre-op clearance, the patient was taken to the operating room on 05/07/2013 and underwent  Procedure(s): ANTERIOR CERVICAL DISCECTOMY FUSION   (ACDF C5-7 ) (2 LEVELS) .    Patient was given perioperative antibiotics: Anti-infectives   Start     Dose/Rate Route Frequency Ordered Stop   05/07/13 1415  ceFAZolin (ANCEF) IVPB 1 g/50 mL premix     1 g 100 mL/hr over 30 Minutes Intravenous Every 8 hours 05/07/13 1347 05/08/13 0242   05/06/13 1418  ceFAZolin (ANCEF) IVPB 2 g/50 mL premix     2 g 100 mL/hr over 30 Minutes Intravenous 30 min pre-op 05/06/13 1418 05/07/13 0840       Patient was given sequential compression devices and early ambulation to prevent DVT.   Patient benefited maximally from hospital stay and there were no complications. At the time of discharge, the patient was  urinating/moving their bowels without difficulty, tolerating a regular diet, pain is controlled with oral pain medications and they have been cleared by PT/OT.   Recent vital signs: Patient Vitals for the past 24 hrs:  BP Temp Pulse Resp SpO2  05/08/13 0529 107/63 mmHg 97.9 F (36.6 C) 81 16 96 %  05/08/13 0219 109/66 mmHg 97.4 F (36.3 C) 69 18 95 %  05/07/13 2153 112/66 mmHg 97.9 F (36.6 C) 64 16 97 %  05/07/13 1826 117/62 mmHg - 62 - -  05/07/13 1330 123/70 mmHg 99 F (37.2 C) 95 16 97 %  05/07/13 1300 131/71 mmHg 98.3 F (36.8 C) 77 10 99 %  05/07/13 1245 130/71 mmHg - 80 16 100 %  05/07/13 1230 129/74 mmHg - 71 14 100 %  05/07/13 1215 129/75 mmHg - 86 11 100 %  05/07/13 1200 133/75 mmHg 98.2 F (36.8 C) 81 15 100 %     Recent laboratory studies:  Recent Labs  05/06/13 1353  WBC 7.3  HGB 15.3  HCT 43.5  PLT 248     Discharge Medications:     Medication List    STOP taking these medications       naproxen sodium 220 MG tablet  Commonly known as:  ANAPROX      TAKE these medications       docusate sodium 100 MG capsule  Commonly known as:  COLACE  Take 1 capsule (100 mg total) by mouth 3 (three) times  daily as needed for constipation.     methocarbamol 500 MG tablet  Commonly known as:  ROBAXIN  Take 1 tablet (500 mg total) by mouth 3 (three) times daily as needed.     ondansetron 4 MG tablet  Commonly known as:  ZOFRAN  Take 1 tablet (4 mg total) by mouth every 8 (eight) hours as needed for nausea.     oxyCODONE-acetaminophen 10-325 MG per tablet  Commonly known as:  PERCOCET  Take 1 tablet by mouth every 4 (four) hours as needed for pain.     polyethylene glycol powder powder  Commonly known as:  GLYCOLAX  Take 17 g by mouth daily.        Diagnostic Studies: Dg Cervical Spine 2-3 Views  05/07/2013   *RADIOLOGY REPORT*  Clinical Data:  C5-C7 anterior cervical discectomy and fusion.  CERVICAL SPINE - 2-3 VIEW,DG C-ARM 61-120 MIN  Comparison:  05/06/2013.  Findings: Technique:  C-arm fluoroscopic images were obtained intraoperatively and submitted for postoperative interpretation. Please see the performing provider's procedural report for the fluoroscopy time utilized.  AP and cross-table lateral c-arm views of the cervical spine demonstrate interval anterior discectomy and fusion from C5-C7 with an anterior plate, screws and prosthetic discs. The hardware appears well positioned. The alignment is normal. No complications are identified  IMPRESSION: No demonstrated complication following C5-C7 ACDF.   Original Report Authenticated By: Carey Bullocks, M.D.   Dg Cervical Spine 2 Or 3 Views  05/07/2013   *RADIOLOGY REPORT*  Clinical Data: Postop cervical fusion.  CERVICAL SPINE - 2-3 VIEW  Comparison: Preoperative radiographs 05/06/2013.  Findings: AP and cross-table lateral portable views of the cervical spine demonstrate interval anterior discectomy and fusion from C5- C7 with an anterior plate, screws and prosthetic discs.  The hardware appears well positioned.  The alignment is normal.  No complications are identified.  IMPRESSION: No demonstrated complication following C5-C7 ACDF.   Original Report Authenticated By: Carey Bullocks, M.D.   Dg Cervical Spine 2 Or 3 Views  05/06/2013   *RADIOLOGY REPORT*  Clinical Data: Preoperative radiograph.  Cervical spine surgery. Preoperative labeling.  CERVICAL SPINE - 2-3 VIEW  Comparison: None.  Findings: Straightening of the normal cervical lordosis.  Between 1 mm and 2 mm anterolisthesis of C4 on C5.  1 mm retrolisthesis of C5 on C6.  Collapse of the disc space at C5-C6 and to a lesser extent at C6-C7 with posterior osteophytic spurring.  Prevertebral soft tissues are normal.  IMPRESSION: Mid to lower at C5-C6 and C6-C7.   Original Report Authenticated By: Andreas Newport, M.D.   Dg C-arm [phone] Min  05/07/2013   *RADIOLOGY REPORT*  Clinical Data:  C5-C7 anterior cervical discectomy and fusion.  CERVICAL  SPINE - 2-3 VIEW,DG C-ARM 61-120 MIN  Comparison: 05/06/2013.  Findings: Technique:  C-arm fluoroscopic images were obtained intraoperatively and submitted for postoperative interpretation. Please see the performing provider's procedural report for the fluoroscopy time utilized.  AP and cross-table lateral c-arm views of the cervical spine demonstrate interval anterior discectomy and fusion from C5-C7 with an anterior plate, screws and prosthetic discs. The hardware appears well positioned. The alignment is normal. No complications are identified  IMPRESSION: No demonstrated complication following C5-C7 ACDF.   Original Report Authenticated By: Carey Bullocks, M.D.          Follow-up Information   Follow up with Alvy Beal, MD. Schedule an appointment as soon as possible for a visit in 2 weeks.   Contact information:   [address] [place] [address] [phone]       Discharge Plan:  discharge to home  Disposition: stable    Signed: Venita Lick D for Dr. Venita Lick [facility] [phone] 05/08/2013, 10:23 AM

## 2013-05-08 NOTE — Care Management Note (Signed)
    Page 1 of 1   05/08/2013     2:12:01 PM   CARE MANAGEMENT NOTE 05/08/2013  Patient:  Harold Jacobs, Harold Jacobs   Account Number:  [id number]  Date Initiated:  05/08/2013  Documentation initiated by:  Jacquelynn Cree  Subjective/Objective Assessment:   admitted postop ACDF C5-6, C6-7     Action/Plan:   plan return home  PT/OT evals-no follow up or equipment recommened   Anticipated DC Date:  05/08/2013   Anticipated DC Plan:  HOME/SELF CARE      DC Planning Services  CM consult      Choice offered to / List presented to:             Status of service:  Completed, signed off Medicare Important Message given?   (If response is "NO", the following Medicare IM given date fields will be blank) Date Medicare IM given:   Date Additional Medicare IM given:    Discharge Disposition:  HOME/SELF CARE  Per UR Regulation:  Reviewed for med. necessity/level of care/duration of stay  If discussed at Long Length of Stay Meetings, dates discussed:    Comments:

## 2013-05-08 NOTE — Progress Notes (Signed)
Physical Therapy Treatment Patient Details Name: Harold Jacobs MRN: [id number] DOB: 1971/07/07 Today's Date: 05/08/2013 Time: 8295-6213 PT Time Calculation (min): 11 min  PT Assessment / Plan / Recommendation  PT Comments   Pt is amb and transferring at independent to mod I level. Is safe from mobility standpoint to D/C home today. Will have girlfriend for 24/7 (A) upon initial D/C.   Follow Up Recommendations  Supervision - Intermittent     Does the patient have the potential to tolerate intense rehabilitation     Barriers to Discharge        Equipment Recommendations  None recommended by PT    Recommendations for Other Services    Frequency Min 5X/week   Progress towards PT Goals Progress towards PT goals: Goals met/education completed, patient discharged from PT  Plan Current plan remains appropriate    Precautions / Restrictions Precautions Precautions: Cervical Precaution Comments: pt able to recall 1/3 cervical precautions Required Braces or Orthoses: Cervical Brace Cervical Brace: Hard collar;At all times Restrictions Weight Bearing Restrictions: No   Pertinent Vitals/Pain "i feel better now then i did before surgery"     Mobility  Bed Mobility Bed Mobility: Not assessed (pt sitting in chair ) Transfers Transfers: Sit to Stand;Stand to Sit Sit to Stand: 7: Independent;From chair/3-in-1;With armrests Stand to Sit: 7: Independent;To chair/3-in-1;With armrests Details for Transfer Assistance: pt demo good technique Ambulation/Gait Ambulation/Gait Assistance: 6: Modified independent (Device/Increase time) Ambulation Distance (Feet): 400 Feet Assistive device: None Ambulation/Gait Assistance Details: demo good technique and ability to maintain cervical neck precautions Gait Pattern: Narrow base of support;Within Functional Limits Gait velocity: WFL Stairs: Yes Stairs Assistance: 6: Modified independent (Device/Increase time) Stairs Assistance Details (indicate  cue type and reason): min cues for safety; encouraged to use handrails when available  Stair Management Technique: No rails;One rail Right;Step to pattern;Forwards Number of Stairs: 5 Wheelchair Mobility Wheelchair Mobility: No    Exercises     PT Diagnosis:    PT Problem List:   PT Treatment Interventions:     PT Goals (current goals can now be found in the care plan section) Acute Rehab PT Goals Patient Stated Goal: home today   Visit Information  Last PT Received On: 05/08/13 Assistance Needed: +1 History of Present Illness: pt s/p anterior cervical diskectomy and fusion C5-C7    Subjective Data  Subjective: My only problem is my bottom hurts from sleeping in that bed. Patient Stated Goal: home today    Cognition  Cognition Arousal/Alertness: Awake/alert Behavior During Therapy: WFL for tasks assessed/performed Overall Cognitive Status: Within Functional Limits for tasks assessed    Balance  Balance Balance Assessed: No  End of Session PT - End of Session Equipment Utilized During Treatment: Cervical collar Activity Tolerance: Patient tolerated treatment well Patient left: in chair;with family/visitor present;with call bell/phone within reach Nurse Communication: Mobility status   GP Functional Assessment Tool Used: clinical judgement Functional Limitation: Mobility: Walking and moving around Mobility: Walking and Moving Around Current Status ([id number]): At least 1 percent but less than 20 percent impaired, limited or restricted Mobility: Walking and Moving Around Goal Status [phone]): 0 percent impaired, limited or restricted Mobility: Walking and Moving Around Discharge Status [phone]): 0 percent impaired, limited or restricted   Donell Sievert, [place] [phone] 05/08/2013, 8:13 AM

## 2013-05-08 NOTE — Progress Notes (Signed)
    Subjective: Procedure(s) (LRB): ANTERIOR CERVICAL DISCECTOMY FUSION   (ACDF C5-7 ) (2 LEVELS)  (N/A) 1 Day Post-Op  Patient reports pain as 2 on 0-10 scale.  Reports decreased arm pain reports incisional neck pain   Positive void Negative bowel movement Positive flatus Negative chest pain or shortness of breath  Objective: Vital signs in last 24 hours: Temp:  [97.4 F (36.3 C)-99 F (37.2 C)] 97.9 F (36.6 C) (06/26 0529) Pulse Rate:  [62-95] 81 (06/26 0529) Resp:  [10-18] 16 (06/26 0529) BP: (107-133)/(62-75) 107/63 mmHg (06/26 0529) SpO2:  [95 %-100 %] 96 % (06/26 0529)  Intake/Output from previous day: 06/25 0701 - 06/26 0700 In: 2700 [I.V.:2700] Out: 60 [Blood:60]  Labs:  Recent Labs  05/06/13 1353  WBC 7.3  RBC 4.93  HCT 43.5  PLT 248   No results found for this basename: NA, K, CL, CO2, BUN, CREATININE, GLUCOSE, CALCIUM,  in the last 72 hours No results found for this basename: LABPT, INR,  in the last 72 hours  Physical Exam: Neurologically intact ABD soft Intact pulses distally Incision: dressing C/D/I and no drainage Compartment soft  Assessment/Plan: Patient stable  xrays satisfactory Mobilization with physical therapy Encourage incentive spirometry Continue care  Up with therapy Patient doing well.  Plan on d/c to home today  Venita Lick, MD [facility] [phone]

## 2013-05-08 NOTE — Evaluation (Signed)
Occupational Therapy Evaluation Patient Details Name: Harold Jacobs MRN: [id number] DOB: [birth year] Today's Date: 05/08/2013 Time: 4098-1191 OT Time Calculation (min): 22 min  OT Assessment / Plan / Recommendation History of present illness pt s/p anterior cervical diskectomy and fusion C5-C7   Clinical Impression   Pt is recovering from ACDF.  All education completed with regard to precautions during ADL/IADL.  Pt is performing at a mod independent to independent level.  No further OT needs.  No equipment needs.    OT Assessment  Patient does not need any further OT services    Follow Up Recommendations  No OT follow up;Supervision - Intermittent    Barriers to Discharge      Equipment Recommendations  None recommended by OT    Recommendations for Other Services    Frequency       Precautions / Restrictions Precautions Precautions: Cervical Precaution Comments: instructed in cervical precautions Required Braces or Orthoses: Cervical Brace Cervical Brace: Hard collar;At all times Restrictions Weight Bearing Restrictions: No   Pertinent Vitals/Pain Premedicated, drinking cold beverage for pain with swallowing    ADL  Eating/Feeding: Independent Where Assessed - Eating/Feeding: Chair Grooming: Wash/dry hands;Independent Where Assessed - Grooming: Unsupported standing Upper Body Bathing: Modified independent Where Assessed - Upper Body Bathing: Supported standing Lower Body Bathing: Modified independent Where Assessed - Lower Body Bathing: Unsupported sitting;Supported sit to stand Upper Body Dressing: Independent Where Assessed - Upper Body Dressing: Unsupported sitting Lower Body Dressing: Modified independent Where Assessed - Lower Body Dressing: Unsupported sitting;Supported sit to stand Toilet Transfer: Modified independent Toilet Transfer Method: Sit to Barista: Comfort height toilet Toileting - Clothing Manipulation and Hygiene:  Modified independent Where Assessed - Engineer, mining and Hygiene: Standing Tub/Shower Transfer: Modified independent Tub/Shower Transfer Method: Ambulating Transfers/Ambulation Related to ADLs: independent, no device ADL Comments: instructed in techniques in order to adhere to cervical precautions for ADL and avoidance of lifting and arching during IADL.    OT Diagnosis:    OT Problem List:   OT Treatment Interventions:     OT Goals(Current goals can be found in the care plan section) Acute Rehab OT Goals Patient Stated Goal: home today   Visit Information  Last OT Received On: 05/08/13 Assistance Needed: +1 History of Present Illness: pt s/p anterior cervical diskectomy and fusion C5-C7       Prior Functioning     Home Living Family/patient expects to be discharged to:: Private residence Living Arrangements: Alone Available Help at Discharge: Friend(s);Available 24 hours/day Type of Home: House Home Access: Stairs to enter [employer] of Steps: 2 Entrance Stairs-Rails: None Home Layout: Two level Alternate Level Stairs-Number of Steps: 12-15 Alternate Level Stairs-Rails: Left Home Equipment: None Prior Function Level of Independence: Independent Communication Communication: No difficulties Dominant Hand: Right         Vision/Perception Vision - History Baseline Vision: No visual deficits Patient Visual Report: No change from baseline   Cognition  Cognition Arousal/Alertness: Awake/alert Behavior During Therapy: WFL for tasks assessed/performed Overall Cognitive Status: Within Functional Limits for tasks assessed    Extremity/Trunk Assessment Upper Extremity Assessment Upper Extremity Assessment: Overall WFL for tasks assessed (not formally assessed due to cervical precautions) Cervical / Trunk Assessment Cervical / Trunk Assessment: Normal     Mobility Bed Mobility Bed Mobility: Not assessed Transfers Transfers: Sit to  Stand;Stand to Sit Sit to Stand: 7: Independent;From chair/3-in-1;With armrests Stand to Sit: 7: Independent;To chair/3-in-1;With armrests Details for Transfer Assistance: pt demo good technique  Exercise     Balance Balance Balance Assessed: No   End of Session OT - End of Session Activity Tolerance: Patient tolerated treatment well Patient left: in chair;with call bell/phone within reach;with family/visitor present  GO Functional Assessment Tool Used: clincial judgement Functional Limitation: Self care Self Care Current Status ([id number]): At least 1 percent but less than 20 percent impaired, limited or restricted Self Care Goal Status (U0454): At least 1 percent but less than 20 percent impaired, limited or restricted Self Care Discharge Status [phone]): At least 1 percent but less than 20 percent impaired, limited or restricted   Evern Bio 05/08/2013, 9:01 AM [phone]

## 2013-05-09 ENCOUNTER — Encounter (HOSPITAL_COMMUNITY): Payer: Self-pay | Admitting: Orthopedic Surgery

## 2016-05-24 ENCOUNTER — Encounter (HOSPITAL_COMMUNITY): Payer: Self-pay | Admitting: *Deleted

## 2016-05-24 MED ORDER — CEFAZOLIN SODIUM-DEXTROSE 2-4 GM/100ML-% IV SOLN
2.0000 g | INTRAVENOUS | Status: AC
  Administered 2016-05-25: 2 g via INTRAVENOUS
  Filled 2016-05-24: qty 100

## 2016-05-24 NOTE — Progress Notes (Signed)
Pt denies SOB, chest pain, and being under the care of a cardiologist. Pt denies having a stress test, echo and cardiac cath. Pt denies having a chest x ray and EKG within the last year. Pt made aware to stop taking Aspirin, vitamins, fish oil and herbal medications. Do not take any NSAIDs ie: Ibuprofen, Advil, Naproxen, BC and Goody Powder or any medication containing Aspirin. Pt verbalized understanding of all pre-op instructions. 

## 2016-05-25 ENCOUNTER — Ambulatory Visit (HOSPITAL_COMMUNITY): Payer: BLUE CROSS/BLUE SHIELD

## 2016-05-25 ENCOUNTER — Ambulatory Visit (HOSPITAL_COMMUNITY): Payer: BLUE CROSS/BLUE SHIELD | Admitting: Anesthesiology

## 2016-05-25 ENCOUNTER — Encounter (HOSPITAL_COMMUNITY)
Admission: RE | Disposition: A | Discharge status: Home / Self Care | Payer: Self-pay | Source: Ambulatory Visit | Attending: Orthopedic Surgery

## 2016-05-25 ENCOUNTER — Observation Stay (HOSPITAL_COMMUNITY)
Admission: RE | Admit: 2016-05-25 | Discharge: 2016-05-26 | Disposition: A | Discharge status: Home / Self Care | Payer: BLUE CROSS/BLUE SHIELD | Source: Ambulatory Visit | Attending: Orthopedic Surgery | Admitting: Orthopedic Surgery

## 2016-05-25 ENCOUNTER — Encounter (HOSPITAL_COMMUNITY): Payer: Self-pay | Admitting: *Deleted

## 2016-05-25 ENCOUNTER — Other Ambulatory Visit: Payer: Self-pay | Admitting: Cardiology

## 2016-05-25 DIAGNOSIS — Z01810 Encounter for preprocedural cardiovascular examination: Secondary | ICD-10-CM | POA: Diagnosis not present

## 2016-05-25 DIAGNOSIS — X58XXXA Exposure to other specified factors, initial encounter: Secondary | ICD-10-CM | POA: Insufficient documentation

## 2016-05-25 DIAGNOSIS — I429 Cardiomyopathy, unspecified: Secondary | ICD-10-CM | POA: Diagnosis not present

## 2016-05-25 DIAGNOSIS — I447 Left bundle-branch block, unspecified: Secondary | ICD-10-CM | POA: Diagnosis not present

## 2016-05-25 DIAGNOSIS — Z419 Encounter for procedure for purposes other than remedying health state, unspecified: Secondary | ICD-10-CM

## 2016-05-25 DIAGNOSIS — I519 Heart disease, unspecified: Secondary | ICD-10-CM

## 2016-05-25 DIAGNOSIS — S82142A Displaced bicondylar fracture of left tibia, initial encounter for closed fracture: Secondary | ICD-10-CM | POA: Diagnosis present

## 2016-05-25 HISTORY — DX: Left bundle-branch block, unspecified: I44.7

## 2016-05-25 HISTORY — DX: Cardiac murmur, unspecified: R01.1

## 2016-05-25 HISTORY — PX: ORIF PROXIMAL TIBIAL PLATEAU FRACTURE: SUR953

## 2016-05-25 HISTORY — PX: ORIF TIBIA PLATEAU: SHX2132

## 2016-05-25 LAB — CREATININE, SERUM: Creatinine, Ser: 1.05 mg/dL (ref 0.61–1.24)

## 2016-05-25 LAB — ECHOCARDIOGRAM COMPLETE
E decel time: 268 msec
EERAT: 3.76
FS: 37 % (ref 28–44)
Height: 71.5 in
IVS/LV PW RATIO, ED: 1.1
LA ID, A-P, ES: 34 mm
LA diam end sys: 34 mm
LADIAMINDEX: 1.58 cm/m2
LAVOL: 35.9 mL
LAVOLA4C: 40.2 mL
LAVOLIN: 16.7 mL/m2
LDCA: 4.91 cm2
LV E/e' medial: 3.76
LV E/e'average: 3.76
LV PW d: 13.1 mm — AB (ref 0.6–1.1)
LV SIMPSON'S DISK: 57
LV TDI E'LATERAL: 12.2
LV dias vol: 118 mL (ref 62–150)
LV e' LATERAL: 12.2 cm/s
LVDIAVOLIN: 55 mL/m2
LVOT SV: 93 mL
LVOT VTI: 18.9 cm
LVOT peak vel: 98.6 cm/s
LVOTD: 25 mm
LVSYSVOL: 51 mL (ref 21–61)
LVSYSVOLIN: 24 mL/m2
MV Dec: 268
MV pk A vel: 63.4 m/s
MV pk E vel: 45.9 m/s
RV LATERAL S' VELOCITY: 13.5 cm/s
RV TAPSE: 28.1 mm
Stroke v: 67 ml
TDI e' medial: 8.7
Weight: 3200 oz

## 2016-05-25 LAB — CBC
HEMATOCRIT: 44.4 % (ref 39.0–52.0)
Hemoglobin: 14.7 g/dL (ref 13.0–17.0)
MCH: 30.4 pg (ref 26.0–34.0)
MCHC: 33.1 g/dL (ref 30.0–36.0)
MCV: 91.9 fL (ref 78.0–100.0)
Platelets: 311 10*3/uL (ref 150–400)
RBC: 4.83 MIL/uL (ref 4.22–5.81)
RDW: 12.4 % (ref 11.5–15.5)
WBC: 4.9 10*3/uL (ref 4.0–10.5)

## 2016-05-25 SURGERY — OPEN REDUCTION INTERNAL FIXATION (ORIF) TIBIAL PLATEAU
Anesthesia: Regional | Site: Knee | Laterality: Left

## 2016-05-25 MED ORDER — CHLORHEXIDINE GLUCONATE 4 % EX LIQD: 60.0000 mL | Freq: Once | CUTANEOUS | Status: DC

## 2016-05-25 MED ORDER — 0.9 % SODIUM CHLORIDE (POUR BTL) OPTIME
TOPICAL | Status: DC | PRN
  Administered 2016-05-25: 1000 mL

## 2016-05-25 MED ORDER — MIDAZOLAM HCL 2 MG/2ML IJ SOLN
INTRAMUSCULAR | Status: AC
  Administered 2016-05-25: 2 mg
  Filled 2016-05-25: qty 2

## 2016-05-25 MED ORDER — ACETAMINOPHEN 325 MG PO TABS
650.0000 mg | ORAL_TABLET | Freq: Four times a day (QID) | ORAL | Status: DC | PRN
  Administered 2016-05-25: 650 mg via ORAL
  Filled 2016-05-25: qty 2

## 2016-05-25 MED ORDER — PROMETHAZINE HCL 25 MG/ML IJ SOLN: 6.2500 mg | INTRAMUSCULAR | Status: DC | PRN

## 2016-05-25 MED ORDER — BISACODYL 5 MG PO TBEC: 5.0000 mg | DELAYED_RELEASE_TABLET | Freq: Every day | ORAL | Status: DC | PRN

## 2016-05-25 MED ORDER — PHENYLEPHRINE HCL 10 MG/ML IJ SOLN
10.0000 mg | INTRAVENOUS | Status: DC | PRN
  Administered 2016-05-25: 20 ug/min via INTRAVENOUS

## 2016-05-25 MED ORDER — MEPERIDINE HCL 25 MG/ML IJ SOLN: 6.2500 mg | INTRAMUSCULAR | Status: DC | PRN

## 2016-05-25 MED ORDER — FENTANYL CITRATE (PF) 100 MCG/2ML IJ SOLN
INTRAMUSCULAR | Status: AC
  Filled 2016-05-25: qty 2

## 2016-05-25 MED ORDER — DIPHENHYDRAMINE HCL 12.5 MG/5ML PO ELIX: 12.5000 mg | ORAL_SOLUTION | ORAL | Status: DC | PRN

## 2016-05-25 MED ORDER — ONDANSETRON HCL 4 MG/2ML IJ SOLN
4.0000 mg | Freq: Four times a day (QID) | INTRAMUSCULAR | Status: DC | PRN
  Administered 2016-05-26 (×2): 4 mg via INTRAVENOUS
  Filled 2016-05-25 (×2): qty 2

## 2016-05-25 MED ORDER — METHOCARBAMOL 1000 MG/10ML IJ SOLN
500.0000 mg | Freq: Four times a day (QID) | INTRAVENOUS | Status: DC | PRN
  Filled 2016-05-25: qty 5

## 2016-05-25 MED ORDER — MIDAZOLAM HCL 2 MG/2ML IJ SOLN
INTRAMUSCULAR | Status: DC | PRN
  Administered 2016-05-25 (×2): 2 mg via INTRAVENOUS

## 2016-05-25 MED ORDER — HYDROMORPHONE HCL 1 MG/ML IJ SOLN
1.0000 mg | INTRAMUSCULAR | Status: DC | PRN
  Administered 2016-05-26 (×3): 1 mg via INTRAVENOUS
  Filled 2016-05-25 (×3): qty 1

## 2016-05-25 MED ORDER — ONDANSETRON HCL 4 MG/2ML IJ SOLN
INTRAMUSCULAR | Status: AC
  Filled 2016-05-25: qty 2

## 2016-05-25 MED ORDER — PROPOFOL 10 MG/ML IV BOLUS
INTRAVENOUS | Status: AC
  Filled 2016-05-25: qty 20

## 2016-05-25 MED ORDER — CEFAZOLIN SODIUM-DEXTROSE 2-4 GM/100ML-% IV SOLN
2.0000 g | Freq: Four times a day (QID) | INTRAVENOUS | Status: AC
  Administered 2016-05-25 – 2016-05-26 (×3): 2 g via INTRAVENOUS
  Filled 2016-05-25 (×4): qty 100

## 2016-05-25 MED ORDER — OXYCODONE HCL 5 MG PO TABS
5.0000 mg | ORAL_TABLET | ORAL | Status: DC | PRN
  Administered 2016-05-26 (×4): 10 mg via ORAL
  Filled 2016-05-25 (×4): qty 2

## 2016-05-25 MED ORDER — MIDAZOLAM HCL 2 MG/2ML IJ SOLN
INTRAMUSCULAR | Status: AC
  Filled 2016-05-25: qty 2

## 2016-05-25 MED ORDER — ROCURONIUM BROMIDE 50 MG/5ML IV SOLN
INTRAVENOUS | Status: AC
  Filled 2016-05-25: qty 1

## 2016-05-25 MED ORDER — LIDOCAINE 2% (20 MG/ML) 5 ML SYRINGE
INTRAMUSCULAR | Status: AC
  Filled 2016-05-25: qty 5

## 2016-05-25 MED ORDER — ACETAMINOPHEN 650 MG RE SUPP: 650.0000 mg | Freq: Four times a day (QID) | RECTAL | Status: DC | PRN

## 2016-05-25 MED ORDER — METOCLOPRAMIDE HCL 5 MG PO TABS: 5.0000 mg | ORAL_TABLET | Freq: Three times a day (TID) | ORAL | Status: DC | PRN

## 2016-05-25 MED ORDER — DOCUSATE SODIUM 100 MG PO CAPS
100.0000 mg | ORAL_CAPSULE | Freq: Two times a day (BID) | ORAL | Status: DC
  Administered 2016-05-25 – 2016-05-26 (×2): 100 mg via ORAL
  Filled 2016-05-25 (×2): qty 1

## 2016-05-25 MED ORDER — LACTATED RINGERS IV SOLN: INTRAVENOUS | Status: DC

## 2016-05-25 MED ORDER — TEMAZEPAM 15 MG PO CAPS: 15.0000 mg | ORAL_CAPSULE | Freq: Every evening | ORAL | Status: DC | PRN

## 2016-05-25 MED ORDER — METHOCARBAMOL 500 MG PO TABS
500.0000 mg | ORAL_TABLET | Freq: Four times a day (QID) | ORAL | Status: DC | PRN
  Administered 2016-05-26 (×2): 500 mg via ORAL
  Filled 2016-05-25 (×2): qty 1

## 2016-05-25 MED ORDER — ONDANSETRON HCL 4 MG PO TABS: 4.0000 mg | ORAL_TABLET | Freq: Four times a day (QID) | ORAL | Status: DC | PRN

## 2016-05-25 MED ORDER — PROPOFOL 1000 MG/100ML IV EMUL
INTRAVENOUS | Status: AC
  Filled 2016-05-25: qty 200

## 2016-05-25 MED ORDER — PHENYLEPHRINE 40 MCG/ML (10ML) SYRINGE FOR IV PUSH (FOR BLOOD PRESSURE SUPPORT)
PREFILLED_SYRINGE | INTRAVENOUS | Status: AC
  Filled 2016-05-25: qty 10

## 2016-05-25 MED ORDER — PROPOFOL 10 MG/ML IV BOLUS
INTRAVENOUS | Status: DC | PRN
  Administered 2016-05-25 (×3): 10 mg via INTRAVENOUS
  Administered 2016-05-25: 70 mg via INTRAVENOUS

## 2016-05-25 MED ORDER — ENOXAPARIN SODIUM 40 MG/0.4ML ~~LOC~~ SOLN
40.0000 mg | SUBCUTANEOUS | Status: DC
  Administered 2016-05-26: 40 mg via SUBCUTANEOUS
  Filled 2016-05-25: qty 0.4

## 2016-05-25 MED ORDER — PROPOFOL 500 MG/50ML IV EMUL
INTRAVENOUS | Status: DC | PRN
  Administered 2016-05-25: 120 ug/kg/min via INTRAVENOUS

## 2016-05-25 MED ORDER — LACTATED RINGERS IV SOLN
INTRAVENOUS | Status: DC
  Administered 2016-05-25: 11:00:00 via INTRAVENOUS

## 2016-05-25 MED ORDER — HYDROMORPHONE HCL 1 MG/ML IJ SOLN: 0.2500 mg | INTRAMUSCULAR | Status: DC | PRN

## 2016-05-25 MED ORDER — METOCLOPRAMIDE HCL 5 MG/ML IJ SOLN
5.0000 mg | Freq: Three times a day (TID) | INTRAMUSCULAR | Status: DC | PRN
  Administered 2016-05-26: 10 mg via INTRAVENOUS
  Filled 2016-05-25: qty 2

## 2016-05-25 MED ORDER — FENTANYL CITRATE (PF) 250 MCG/5ML IJ SOLN
INTRAMUSCULAR | Status: AC
  Filled 2016-05-25: qty 5

## 2016-05-25 MED ORDER — FENTANYL CITRATE (PF) 100 MCG/2ML IJ SOLN
INTRAMUSCULAR | Status: AC
  Administered 2016-05-25: 100 ug
  Filled 2016-05-25: qty 2

## 2016-05-25 MED ORDER — FENTANYL CITRATE (PF) 250 MCG/5ML IJ SOLN
INTRAMUSCULAR | Status: DC | PRN
  Administered 2016-05-25: 100 ug via INTRAVENOUS
  Administered 2016-05-25: 50 ug via INTRAVENOUS
  Administered 2016-05-25: 100 ug via INTRAVENOUS

## 2016-05-25 MED ORDER — LIDOCAINE-EPINEPHRINE 2 %-1:100000 IJ SOLN
INTRAMUSCULAR | Status: DC | PRN
  Administered 2016-05-25: 20 mL via INTRADERMAL

## 2016-05-25 MED ORDER — LIDOCAINE 2% (20 MG/ML) 5 ML SYRINGE
INTRAMUSCULAR | Status: DC | PRN
  Administered 2016-05-25: 60 mg via INTRAVENOUS

## 2016-05-25 MED ORDER — BUPIVACAINE-EPINEPHRINE (PF) 0.5% -1:200000 IJ SOLN
INTRAMUSCULAR | Status: DC | PRN
  Administered 2016-05-25: 40 mL via PERINEURAL

## 2016-05-25 MED ORDER — POLYETHYLENE GLYCOL 3350 17 G PO PACK: 17.0000 g | PACK | Freq: Every day | ORAL | Status: DC | PRN

## 2016-05-25 MED ORDER — ONDANSETRON HCL 4 MG/2ML IJ SOLN
INTRAMUSCULAR | Status: DC | PRN
  Administered 2016-05-25: 4 mg via INTRAVENOUS

## 2016-05-25 SURGICAL SUPPLY — 85 items
BANDAGE ACE 6X5 VEL STRL LF (GAUZE/BANDAGES/DRESSINGS) ×3 IMPLANT
BANDAGE ELASTIC 4 VELCRO ST LF (GAUZE/BANDAGES/DRESSINGS) IMPLANT
BANDAGE ELASTIC 6 VELCRO ST LF (GAUZE/BANDAGES/DRESSINGS) IMPLANT
BIT DRILL 100X2.5XANTM LCK (BIT) ×1 IMPLANT
BIT DRILL CAL (BIT) ×1 IMPLANT
BIT DRILL SLEEVE MEASURING (BIT) ×1 IMPLANT
BIT DRL 100X2.5XANTM LCK (BIT) ×1
BLADE SURG 10 STRL SS (BLADE) ×3 IMPLANT
BLADE SURG ROTATE 9660 (MISCELLANEOUS) IMPLANT
BNDG COHESIVE 6X5 TAN STRL LF (GAUZE/BANDAGES/DRESSINGS) ×3 IMPLANT
BNDG GAUZE ELAST 4 BULKY (GAUZE/BANDAGES/DRESSINGS) ×3 IMPLANT
BONE CANC CHIPS 20CC PCAN1/4 (Bone Implant) ×3 IMPLANT
CHIPS CANC BONE 20CC PCAN1/4 (Bone Implant) ×1 IMPLANT
CLEANER TIP ELECTROSURG 2X2 (MISCELLANEOUS) ×3 IMPLANT
CLOSURE WOUND 1/2 X4 (GAUZE/BANDAGES/DRESSINGS) ×1
COVER MAYO STAND STRL (DRAPES) ×3 IMPLANT
CUFF TOURNIQUET SINGLE 34IN LL (TOURNIQUET CUFF) IMPLANT
CUFF TOURNIQUET SINGLE 44IN (TOURNIQUET CUFF) IMPLANT
DRAPE C-ARM 42X72 X-RAY (DRAPES) IMPLANT
DRAPE INCISE IOBAN 66X45 STRL (DRAPES) ×6 IMPLANT
DRAPE U-SHAPE 47X51 STRL (DRAPES) ×3 IMPLANT
DRILL BIT 2.5MM (BIT) ×2
DRILL BIT 2.7X100 214235006 DU (MISCELLANEOUS) ×3 IMPLANT
DRILL BIT CAL (BIT) ×3
DRILL SLEEVE MEASURING (BIT) ×3
DRSG ADAPTIC 3X8 NADH LF (GAUZE/BANDAGES/DRESSINGS) ×3 IMPLANT
DRSG PAD ABDOMINAL 8X10 ST (GAUZE/BANDAGES/DRESSINGS) ×6 IMPLANT
DURAPREP 26ML APPLICATOR (WOUND CARE) ×3 IMPLANT
ELECT REM PT RETURN 9FT ADLT (ELECTROSURGICAL) ×3
ELECTRODE REM PT RTRN 9FT ADLT (ELECTROSURGICAL) ×1 IMPLANT
GAUZE SPONGE 4X4 12PLY STRL (GAUZE/BANDAGES/DRESSINGS) ×3 IMPLANT
GLOVE BIO SURGEON STRL SZ7.5 (GLOVE) ×3 IMPLANT
GLOVE BIO SURGEON STRL SZ8 (GLOVE) ×3 IMPLANT
GLOVE EUDERMIC 7 POWDERFREE (GLOVE) ×3 IMPLANT
GLOVE SS BIOGEL STRL SZ 7.5 (GLOVE) ×1 IMPLANT
GLOVE SUPERSENSE BIOGEL SZ 7.5 (GLOVE) ×2
GOWN STRL REUS W/ TWL LRG LVL3 (GOWN DISPOSABLE) ×1 IMPLANT
GOWN STRL REUS W/ TWL XL LVL3 (GOWN DISPOSABLE) ×2 IMPLANT
GOWN STRL REUS W/TWL LRG LVL3 (GOWN DISPOSABLE) ×2
GOWN STRL REUS W/TWL XL LVL3 (GOWN DISPOSABLE) ×4
IMMOBILIZER KNEE 22 (SOFTGOODS) ×3 IMPLANT
K-WIRE ACE 1.6X6 (WIRE) ×6
KIT BASIN OR (CUSTOM PROCEDURE TRAY) ×3 IMPLANT
KIT ROOM TURNOVER OR (KITS) ×3 IMPLANT
KWIRE ACE 1.6X6 (WIRE) ×2 IMPLANT
MANIFOLD NEPTUNE II (INSTRUMENTS) ×3 IMPLANT
NEEDLE 22X1 1/2 (OR ONLY) (NEEDLE) ×3 IMPLANT
NEEDLE MAYO 1/2 CIRCLE (NEEDLE) ×9 IMPLANT
NS IRRIG 1000ML POUR BTL (IV SOLUTION) ×3 IMPLANT
PACK ORTHO EXTREMITY (CUSTOM PROCEDURE TRAY) ×3 IMPLANT
PAD ABD 8X10 STRL (GAUZE/BANDAGES/DRESSINGS) ×3 IMPLANT
PAD ARMBOARD 7.5X6 YLW CONV (MISCELLANEOUS) ×6 IMPLANT
PAD CAST 4YDX4 CTTN HI CHSV (CAST SUPPLIES) IMPLANT
PADDING CAST COTTON 4X4 STRL (CAST SUPPLIES)
PADDING CAST COTTON 6X4 STRL (CAST SUPPLIES) IMPLANT
PLATE LOCK 5H STD LT PROX TIB (Plate) ×3 IMPLANT
PUTTY DBM STAGRAFT PLUS 5CC (Putty) ×3 IMPLANT
SCREW 3.5X85 LOPROFILE (Screw) ×3 IMPLANT
SCREW CORT 3.5X40 (Screw) ×3 IMPLANT
SCREW CORTICAL 3.5X46MM (Screw) ×3 IMPLANT
SCREW LOCK CORT STAR 3.5X70 (Screw) ×3 IMPLANT
SCREW LP 3.5 (Screw) ×3 IMPLANT
SCREW LP 3.5X60MM (Screw) ×3 IMPLANT
SCREW LP 3.5X65MM (Screw) ×6 IMPLANT
SPONGE LAP 18X18 X RAY DECT (DISPOSABLE) ×3 IMPLANT
STAPLER VISISTAT 35W (STAPLE) IMPLANT
STOCKINETTE IMPERVIOUS LG (DRAPES) ×3 IMPLANT
STRIP CLOSURE SKIN 1/2X4 (GAUZE/BANDAGES/DRESSINGS) ×2 IMPLANT
SUCTION FRAZIER HANDLE 10FR (MISCELLANEOUS) ×2
SUCTION TUBE FRAZIER 10FR DISP (MISCELLANEOUS) ×1 IMPLANT
SUT MNCRL AB 3-0 PS2 18 (SUTURE) ×3 IMPLANT
SUT VIC AB 0 CT1 27 (SUTURE) ×2
SUT VIC AB 0 CT1 27XBRD ANBCTR (SUTURE) ×1 IMPLANT
SUT VIC AB 1 CT1 27 (SUTURE) ×4
SUT VIC AB 1 CT1 27XBRD ANBCTR (SUTURE) ×2 IMPLANT
SUT VIC AB 2-0 CT1 27 (SUTURE) ×2
SUT VIC AB 2-0 CT1 TAPERPNT 27 (SUTURE) ×1 IMPLANT
SYR 20ML ECCENTRIC (SYRINGE) ×3 IMPLANT
TOWEL OR 17X24 6PK STRL BLUE (TOWEL DISPOSABLE) ×3 IMPLANT
TOWEL OR 17X26 10 PK STRL BLUE (TOWEL DISPOSABLE) ×3 IMPLANT
TUBE CONNECTING 12'X1/4 (SUCTIONS) ×1
TUBE CONNECTING 12X1/4 (SUCTIONS) ×2 IMPLANT
WATER STERILE IRR 1000ML POUR (IV SOLUTION) ×6 IMPLANT
WRAP KNEE MAXI GEL POST OP (GAUZE/BANDAGES/DRESSINGS) ×3 IMPLANT
YANKAUER SUCT BULB TIP NO VENT (SUCTIONS) ×3 IMPLANT

## 2016-05-25 NOTE — Progress Notes (Signed)
Surgery cancelled per Dr. Earnestine Mealing due to abnormal EKG, to be transferred to ED for further cardiac testing. Dr. Rennis Chris in to see pt now. Report called to ED.

## 2016-05-25 NOTE — Progress Notes (Signed)
Bedside ECHO being performed

## 2016-05-25 NOTE — Op Note (Signed)
05/25/2016  4:30 PM  PATIENT:   Harold Jacobs  45 y.o. male  PRE-OPERATIVE DIAGNOSIS:  LEFT LATERAL TIBIAL PLATEAU  FRACTURE  POST-OPERATIVE DIAGNOSIS:  same  PROCEDURE:  ORIF left lateral tibial plateau  SURGEON:  Meryn Sarracino, Vania Rea M.D.  ASSISTANTS: Ralene Bathe, PA-C   ANESTHESIA:   Regional with femoral and popliteal block  EBL: minimal  SPECIMEN:  none  Drains: none   PATIENT DISPOSITION:  PACU - hemodynamically stable.    PLAN OF CARE: Admit for overnight observation  Dictation# done   Contact # [phone]

## 2016-05-25 NOTE — Anesthesia Postprocedure Evaluation (Signed)
Anesthesia Post Note  Patient: Harold Jacobs  Procedure(s) Performed: Procedure(s) (LRB): OPEN REDUCTION INTERNAL FIXATION (ORIF)  LEFT TIBIAL PLATEAU (Left)  Patient location during evaluation: PACU Anesthesia Type: General Level of consciousness: awake and alert Pain management: pain level controlled Vital Signs Assessment: post-procedure vital signs reviewed and stable Respiratory status: spontaneous breathing, nonlabored ventilation, respiratory function stable and patient connected to nasal cannula oxygen Cardiovascular status: blood pressure returned to baseline and stable Postop Assessment: no signs of nausea or vomiting Anesthetic complications: no    Last Vitals:  Filed Vitals:   05/25/16 1730 05/25/16 1738  BP: 114/80   Pulse: 75 77  Temp: 36.5 C   Resp: 10 14    Last Pain:  Filed Vitals:   05/25/16 1739  PainSc: 0-No pain    LLE Motor Response: No movement due to regional block (05/25/16 1730) LLE Sensation: No sensation (absent) (d/t block) (05/25/16 1730)          Harold Jacobs

## 2016-05-25 NOTE — Consult Note (Signed)
Reason for Consult: LBBB in 45 year old male  Referring Physician: Dr. Rennis Chris   PCP:  Betsey Holiday, MD  Primary Cardiologist:new Dr. Ethel Rana Clack is an 45 y.o. male.    Chief Complaint: in peri-op for surgery   HPI:  42 YOM in peri-op with plans for surgery for fx of Lt tibial plateau.   EKG with LBBB no previous EKGs.  He denies chest pain or SOB with exertion.   He is a runner and has been pushing himself more.  No syncope, no palpitations no rapid HR.  He feels great.    ECHO: Study Conclusions  - Left ventricle: The cavity size was normal. There was moderate  concentric hypertrophy. Systolic function was mildly reduced. The  estimated ejection fraction was in the range of 45% to 50%. Wall  motion was normal; there were no regional wall motion  abnormalities. - Ventricular septum: Septal motion showed moderate dyssynergy.  These changes are consistent with a left bundle branch block. - Mitral valve: There was mild regurgitation.   Past Medical History  Diagnosis Date  . Numbness     Hx: of left arm/hand  . PONV (postoperative nausea and vomiting)   . Family history of anesthesia complication     "Mom, PONV" (05/07/2013)  . Migraine     "none in many years" (05/07/2013)  . Tibial plateau fracture, left   . LBBB (left bundle branch block) 05/25/2016    Past Surgical History  Procedure Laterality Date  . Cyst removal neck Right 1990  . Wrist fracture surgery Left 1997  . Nose surgery  1984    "broke it" (05/07/2013)  . Refractive surgery  ~ 2004  . Anterior cervical decomp/discectomy fusion  05/07/2013  . Tonsillectomy  1976  . Anterior cervical decomp/discectomy fusion N/A 05/07/2013    Procedure: ANTERIOR CERVICAL DISCECTOMY FUSION   (ACDF C5-7 ) (2 LEVELS) ;  Surgeon: Venita Lick, MD;  Location: [facility] OR;  Service: Orthopedics;  Laterality: N/A;  cervical five-six, six-seven anterior cervical discectomy and fusion    Family History    Problem Relation Age of Onset  . Hypertension Mother   . Hypertension Father   . Cancer - Prostate Father   MGF with MI in 63s but + diabetes,  PGM with MI in her 35s.   Social History:  reports that he has never smoked. He has never used smokeless tobacco. He reports that he drinks about 5.4 oz of alcohol per week. He reports that he does not use illicit drugs.  Allergies:  Allergies  Allergen Reactions  . No Known Allergies     OUTPATIENT MEDICATIONS:  none   Results for orders placed or performed during the hospital encounter of 05/25/16 (from the past 48 hour(s))  CBC     Status: None   Collection Time: 05/25/16 10:42 AM  Result Value Ref Range   WBC 4.9 4.0 - 10.5 K/uL   RBC 4.83 4.22 - 5.81 MIL/uL   Hemoglobin 14.7 13.0 - 17.0 g/dL   HCT 16.1 09.6 - 04.5 %   MCV 91.9 78.0 - 100.0 fL   MCH 30.4 26.0 - 34.0 pg   MCHC 33.1 30.0 - 36.0 g/dL   RDW 40.9 81.1 - 91.4 %   Platelets 311 150 - 400 K/uL   No results found.  ROS: General:no colds or fevers, no weight changes Skin:no rashes or ulcers HEENT:no blurred vision, no congestion CV:see HPI PUL:see  HPI GI:no diarrhea constipation or melena, no indigestion GU:no hematuria, no dysuria MS:no joint pain, no claudication, + fx , no hx of lower ext edema.  Neuro:no syncope, no lightheadedness Endo:no diabetes, no thyroid disease  Blood pressure 137/81, pulse 93, temperature 98.8 F (37.1 C), temperature source Oral, resp. rate 18, height 5' 11.5" (1.816 m), weight 200 lb (90.719 kg), SpO2 97 %.  Wt Readings from Last 3 Encounters:  05/25/16 200 lb (90.719 kg)  05/06/13 194 lb 7.1 oz (88.199 kg)    PE: General:Pleasant affect, NAD Skin:Warm and dry, brisk capillary refill HEENT:normocephalic, sclera clear, mucus membranes moist Neck:supple, no JVD, no bruits  Heart:S1S2 RRR without murmur, gallup, rub or click Lungs:clear without rales, rhonchi, or wheezes QKM:MNOT, non tender, + BS, do not palpate liver spleen  or masses Ext:no lower ext edema, 2+ pedal pulses, 2+ radial pulses Neuro:alert and oriented, MAE, follows commands, + facial symmetry   Assessment/Plan LBBB - echo only reveals LBBB with septal dyssynchrony.  Dr. Rennis Golden to review.  And it is hope pt will continue on with surgery.    Discussed with Dr. Rennis Golden and plans will be to proceed with surgery.  Pt will follow up with Dr. Rennis Golden in a month to be thorough.Nada Boozer  Nurse Practitioner Certified [facility] Pager [phone] or after 5pm or weekends call [phone] 05/25/2016, 12:58 PM

## 2016-05-25 NOTE — Anesthesia Preprocedure Evaluation (Addendum)
Anesthesia Evaluation  Patient identified by MRN, date of birth, ID band Patient awake    Reviewed: Allergy & Precautions, H&P , NPO status , Patient's Chart, lab work & pertinent test results, reviewed documented beta blocker date and time   History of Anesthesia Complications (+) PONV, Family history of anesthesia reaction and history of anesthetic complications  Airway Mallampati: I  TM Distance: >3 FB Neck ROM: Full    Dental  (+) Teeth Intact, Dental Advisory Given   Pulmonary    breath sounds clear to auscultation       Cardiovascular negative cardio ROS   Rhythm:Regular Rate:Normal     Neuro/Psych  Headaches,    GI/Hepatic negative GI ROS, Neg liver ROS,   Endo/Other  negative endocrine ROS  Renal/GU negative Renal ROS     Musculoskeletal   Abdominal   Peds  Hematology negative hematology ROS (+)   Anesthesia Other Findings   Reproductive/Obstetrics                             Anesthesia Physical  Anesthesia Plan  ASA: I  Anesthesia Plan: Regional   Post-op Pain Management:    Induction: Intravenous  Airway Management Planned:   Additional Equipment:   Intra-op Plan:   Post-operative Plan:   Informed Consent: I have reviewed the patients History and Physical, chart, labs and discussed the procedure including the risks, benefits and alternatives for the proposed anesthesia with the patient or authorized representative who has indicated his/her understanding and acceptance.   Dental advisory given  Plan Discussed with: CRNA and Surgeon  Anesthesia Plan Comments:        Anesthesia Quick Evaluation

## 2016-05-25 NOTE — Progress Notes (Signed)
  Echocardiogram 2D Echocardiogram has been performed.  Harold Jacobs 05/25/2016, 1:31 PM

## 2016-05-25 NOTE — Transfer of Care (Signed)
Immediate Anesthesia Transfer of Care Note  Patient: Harold Jacobs  Procedure(s) Performed: Procedure(s): OPEN REDUCTION INTERNAL FIXATION (ORIF)  LEFT TIBIAL PLATEAU (Left)  Patient Location: PACU  Anesthesia Type:MAC combined with regional for post-op pain  Level of Consciousness: awake, alert  and oriented  Airway & Oxygen Therapy: Patient Spontanous Breathing  Post-op Assessment: Report given to RN, Post -op Vital signs reviewed and stable and block working well  Post vital signs: Reviewed and stable  Last Vitals:  Filed Vitals:   05/25/16 1420 05/25/16 1700  BP: 123/86   Pulse: 92   Temp:  36.5 C  Resp: 17     Last Pain:  Filed Vitals:   05/25/16 1701  PainSc: 2       Patients Stated Pain Goal: 2 (05/25/16 1026)  Complications: No apparent anesthesia complications

## 2016-05-25 NOTE — Anesthesia Procedure Notes (Addendum)
Anesthesia Regional Block:  Femoral nerve block  Pre-Anesthetic Checklist: ,, timeout performed, Correct Patient, Correct Site, Correct Laterality, Correct Procedure, Correct Position, site marked, Risks and benefits discussed, Surgical consent,  Pre-op evaluation,  Post-op pain management  Laterality: Left  Prep: chloraprep       Needles:  Injection technique: Single-shot  Needle Type: Stimulator Needle - 80     Needle Length: 9cm 9 cm Needle Gauge: 22 and 22 G  Needle insertion depth: 6 cm   Additional Needles:  Procedures: ultrasound guided (picture in chart) and nerve stimulator Femoral nerve block  Nerve Stimulator or Paresthesia:  Response: Patellar snap, 0.5 mA,   Additional Responses:   Narrative:  Injection made incrementally with aspirations every 5 mL.  Performed by: Personally  Anesthesiologist: Lewie Loron  Additional Notes: BP cuff, EKG monitors applied. Sedation begun. Femoral artery palpated for location of nerve. After nerve location verified with U/S, anesthetic injected incrementally, slowly, and after negative aspirations under direct u/s guidance. Good perineural spread. Patient tolerated well.   Anesthesia Regional Block:  Popliteal block  Pre-Anesthetic Checklist: ,, timeout performed, Correct Patient, Correct Site, Correct Laterality, Correct Procedure, Correct Position, site marked, Risks and benefits discussed, Surgical consent,  Pre-op evaluation,  Post-op pain management  Laterality: Left  Prep: chloraprep       Needles:  Injection technique: Single-shot  Needle Type: Stimiplex     Needle Length: 10cm 10 cm Needle Gauge: 21 and 21 G    Additional Needles:  Procedures: ultrasound guided (picture in chart) and nerve stimulator  Motor weakness within 5 minutes. Popliteal block  Nerve Stimulator or Paresthesia:  Response: Plantar flexion/toe flexion and foot eversion , 0.5 mA,   Additional Responses:   Narrative:   Injection made incrementally with aspirations every 5 mL.  Performed by: Personally  Anesthesiologist: Lewie Loron  Additional Notes: Nerve located and needle positioned with direct ultrasound guidance. Good perineural spread. Patient tolerated well.   Procedure Name: MAC Date/Time: 05/25/2016 2:37 PM Performed by: Orvilla Fus A Pre-anesthesia Checklist: Patient identified, Emergency Drugs available, Suction available and Patient being monitored Oxygen Delivery Method: Simple face mask Placement Confirmation: positive ETCO2 Dental Injury: Teeth and Oropharynx as per pre-operative assessment

## 2016-05-25 NOTE — Progress Notes (Signed)
CRNA at bedside preparing to take pt back to OR. 

## 2016-05-25 NOTE — H&P (Signed)
Harold Jacobs    Chief Complaint: LEFT LATERAL TIBIAL PLATEAU  FRACTURE HPI: The patient is a 45 y.o. male s/p wake boarding injury to left knee with valgus load and depressed lateral tibial plateau fracture.  Past Medical History  Diagnosis Date  . Numbness     Hx: of left arm/hand  . PONV (postoperative nausea and vomiting)   . Family history of anesthesia complication     "Mom, PONV" (05/07/2013)  . Migraine     "none in many years" (05/07/2013)  . Tibial plateau fracture, left   . LBBB (left bundle branch block) 05/25/2016    Past Surgical History  Procedure Laterality Date  . Cyst removal neck Right 1990  . Wrist fracture surgery Left 1997  . Nose surgery  1984    "broke it" (05/07/2013)  . Refractive surgery  ~ 2004  . Anterior cervical decomp/discectomy fusion  05/07/2013  . Tonsillectomy  1976  . Anterior cervical decomp/discectomy fusion N/A 05/07/2013    Procedure: ANTERIOR CERVICAL DISCECTOMY FUSION   (ACDF C5-7 ) (2 LEVELS) ;  Surgeon: Venita Lick, MD;  Location: [facility] OR;  Service: Orthopedics;  Laterality: N/A;  cervical five-six, six-seven anterior cervical discectomy and fusion    Family History  Problem Relation Age of Onset  . Hypertension Mother   . Hypertension Father   . Cancer - Prostate Father     Social History:  reports that he has never smoked. He has never used smokeless tobacco. He reports that he drinks about 5.4 oz of alcohol per week. He reports that he does not use illicit drugs.   No prescriptions prior to admission     Physical Exam: left knee with tense effusion, diffuse tenderness. N/v intact distally  Vitals  Temp:  [98.8 F (37.1 C)] 98.8 F (37.1 C) (07/13 1026) Pulse Rate:  [93] 93 (07/13 1026) Resp:  [18] 18 (07/13 1026) BP: (137)/(81) 137/81 mmHg (07/13 1026) SpO2:  [97 %] 97 % (07/13 1026) Weight:  [90.719 kg (200 lb)] 90.719 kg (200 lb) (07/13 1026)  Assessment/Plan  Impression: LEFT LATERAL TIBIAL PLATEAU   FRACTURE  Plan of Action: Procedure(s): OPEN REDUCTION INTERNAL FIXATION (ORIF)  LEFT TIBIAL PLATEAU  Harold Jacobs M Saumya Hukill 05/25/2016, 1:47 PM Contact # [phone]

## 2016-05-25 NOTE — Progress Notes (Signed)
Per Dr. Rennis Chris, spoke with cardiology, pt to remain in Short Stay for ECHO and to be seen by cardiologist. Dr. Earnestine Mealing called and informed by this nurse.

## 2016-05-25 NOTE — Progress Notes (Addendum)
Per Dr. Rennis Chris, okay to move forward with surgery as pt has been cleared by cardiology. Dr. Renold Don informed by Dr. Rennis Chris.

## 2016-05-26 ENCOUNTER — Encounter (HOSPITAL_COMMUNITY): Payer: Self-pay | Admitting: Orthopedic Surgery

## 2016-05-26 ENCOUNTER — Other Ambulatory Visit (HOSPITAL_COMMUNITY): Payer: BLUE CROSS/BLUE SHIELD

## 2016-05-26 DIAGNOSIS — S82142A Displaced bicondylar fracture of left tibia, initial encounter for closed fracture: Secondary | ICD-10-CM | POA: Diagnosis not present

## 2016-05-26 MED ORDER — OXYCODONE-ACETAMINOPHEN 5-325 MG PO TABS: 1.0000 | ORAL_TABLET | ORAL | Status: DC | PRN

## 2016-05-26 MED ORDER — ONDANSETRON HCL 4 MG PO TABS: 4.0000 mg | ORAL_TABLET | Freq: Three times a day (TID) | ORAL | Status: DC | PRN

## 2016-05-26 MED ORDER — KETOROLAC TROMETHAMINE 30 MG/ML IJ SOLN
30.0000 mg | Freq: Four times a day (QID) | INTRAMUSCULAR | Status: DC | PRN
  Administered 2016-05-26 (×2): 30 mg via INTRAVENOUS
  Filled 2016-05-26 (×2): qty 1

## 2016-05-26 MED ORDER — DIAZEPAM 5 MG PO TABS: 2.5000 mg | ORAL_TABLET | Freq: Four times a day (QID) | ORAL | Status: DC | PRN

## 2016-05-26 MED ORDER — HYDROMORPHONE HCL 2 MG PO TABS: 2.0000 mg | ORAL_TABLET | ORAL | Status: DC | PRN

## 2016-05-26 NOTE — Progress Notes (Signed)
Shift and discharge notes: patient is alert, awake, resting in the bed, no acute distress noted at present time, had episodes of nausea and vomited once yellow color emesis with food particles, was medicated with Zofran 4 mg IVP with mild relief, patient is being discharged to home today, discharge instructions given to patient with verbalization of understanding and returned demonstration, patient is now ready to be discharged and waiting for his ride.

## 2016-05-26 NOTE — Discharge Summary (Signed)
PATIENT ID:      Harold Jacobs  MRN:     [id number] DOB/AGE:    March 12, 1971 / 45 y.o.     DISCHARGE SUMMARY  ADMISSION DATE:    05/25/2016 DISCHARGE DATE:    ADMISSION DIAGNOSIS: LEFT LATERAL TIBIAL PLATEAU  FRACTURE Past Medical History  Diagnosis Date  . Numbness     Hx: of left arm/hand "preop from my neck"  . PONV (postoperative nausea and vomiting)   . Family history of anesthesia complication     "Mom, PONV" (05/07/2013)  . LBBB (left bundle branch block) 05/25/2016  . Heart murmur   . Migraine     "none in many years" (05/25/2016)    DISCHARGE DIAGNOSIS:   Active Problems:   LBBB (left bundle branch block)   Tibial plateau fracture, left   PROCEDURE: Procedure(s): OPEN REDUCTION INTERNAL FIXATION (ORIF)  LEFT TIBIAL PLATEAU on 05/25/2016  CONSULTS:     HISTORY:  See H&P in chart.  HOSPITAL COURSE:  Harold Jacobs is a 45 y.o. admitted on 05/25/2016 with a diagnosis of LEFT LATERAL TIBIAL PLATEAU  FRACTURE.  They were brought to the operating room on 05/25/2016 and underwent Procedure(s): OPEN REDUCTION INTERNAL FIXATION (ORIF)  LEFT TIBIAL PLATEAU.    They were given perioperative antibiotics: Anti-infectives    Start     Dose/Rate Route Frequency Ordered Stop   05/25/16 2130  ceFAZolin (ANCEF) IVPB 2g/100 mL premix     2 g 200 mL/hr over 30 Minutes Intravenous Every 6 hours 05/25/16 1745 05/26/16 1529   05/25/16 1200  ceFAZolin (ANCEF) IVPB 2g/100 mL premix     2 g 200 mL/hr over 30 Minutes Intravenous To ShortStay Surgical 05/24/16 1027 05/25/16 1437    . Preoperatively patient was found to have a LBBB on EKG requiring a cardiology consult and bedside echo. He was felt safe to proceed with surgery and it was performed under regional with femoral and popliteal block. Please see notes for details  Patient underwent the above named procedure and tolerated it well. He had a rough night and was having some mild nausea the next am. The following day they were  hemodynamically stable and pain was better controlled on oral analgesics. They were neurovascularly intact to the operative extremity. PT was ordered and worked with patient per protocol. They were medically and orthopaedically stable for discharge on day 1.    DIAGNOSTIC STUDIES:  RECENT RADIOGRAPHIC STUDIES :  Dg Knee 1-2 Views Left  05/25/2016  CLINICAL DATA:  ORIF left tibial plateau fracture EXAM: LEFT KNEE - 1-2 VIEW COMPARISON:  None FLUOROSCOPY TIME:  15 seconds FINDINGS: Left lateral tibial plateau fracture transfix with a lateral sideplate and multiple interlocking screws. No hardware failure or complication. Near anatomic alignment. IMPRESSION: Interval ORIF left tibial plateau fracture. Electronically Signed   By: Elige Ko   On: 05/25/2016 16:38   Dg C-arm 1-60 Min  05/25/2016  CLINICAL DATA:  ORIF left tibial plateau fracture EXAM: LEFT KNEE - 1-2 VIEW COMPARISON:  None FLUOROSCOPY TIME:  15 seconds FINDINGS: Left lateral tibial plateau fracture transfix with a lateral sideplate and multiple interlocking screws. No hardware failure or complication. Near anatomic alignment. IMPRESSION: Interval ORIF left tibial plateau fracture. Electronically Signed   By: Elige Ko   On: 05/25/2016 16:38    RECENT VITAL SIGNS:  Patient Vitals for the past 24 hrs:  BP Temp Temp src Pulse Resp SpO2 Height Weight  05/26/16 0408 116/61 mmHg 98.1 F (36.7 C) Oral  74 19 99 % - -  05/25/16 2345 113/62 mmHg 98.6 F (37 C) Oral 82 18 99 % - -  05/25/16 2045 (!) 118/59 mmHg 97.3 F (36.3 C) Oral 82 16 98 % - -  05/25/16 1738 - - - 77 14 98 % - -  05/25/16 1730 114/80 mmHg 97.7 F (36.5 C) - 75 10 100 % - -  05/25/16 1715 112/61 mmHg - - 86 10 100 % - -  05/25/16 1705 - - - 94 13 98 % - -  05/25/16 1702 111/70 mmHg - - 96 (!) 9 98 % - -  05/25/16 1701 - - - 99 - 93 % - -  05/25/16 1700 - 97.7 F (36.5 C) - - - - - -  05/25/16 1420 123/86 mmHg - - 92 17 100 % - -  05/25/16 1415 121/79 mmHg - -  94 18 100 % - -  05/25/16 1410 121/74 mmHg - - 93 16 100 % - -  05/25/16 1405 131/90 mmHg - - 90 18 100 % - -  05/25/16 1026 137/81 mmHg 98.8 F (37.1 C) Oral 93 18 97 % 5' 11.5" (1.816 m) 90.719 kg (200 lb)  .  RECENT EKG RESULTS:    Orders placed or performed during the hospital encounter of 05/25/16  . EKG 12-Lead  . EKG 12-Lead    DISCHARGE INSTRUCTIONS:  Discharge Instructions    Discontinue IV    Complete by:  As directed   After nausea has resolved           DISCHARGE MEDICATIONS:     Medication List    TAKE these medications        diazepam 5 MG tablet  Commonly known as:  VALIUM  Take 0.5-1 tablets (2.5-5 mg total) by mouth every 6 (six) hours as needed for muscle spasms or sedation.     HYDROmorphone 2 MG tablet  Commonly known as:  DILAUDID  Take 1-2 tablets (2-4 mg total) by mouth every 4 (four) hours as needed for severe pain (not controlled with percocet).     ondansetron 4 MG tablet  Commonly known as:  ZOFRAN  Take 1 tablet (4 mg total) by mouth every 8 (eight) hours as needed for nausea or vomiting.     oxyCODONE-acetaminophen 5-325 MG tablet  Commonly known as:  PERCOCET  Take 1-2 tablets by mouth every 4 (four) hours as needed.        FOLLOW UP VISIT:       Follow-up Information    Follow up with Chrystie Nose, MD On 06/29/2016.   Specialty:  Cardiology   Why:  the Cardilogist  at 8:30 AM  for follow up.     Contact information:   [address] [place] [address] [phone]       Follow up with [facility] On 06/20/2016.   Specialty:  Cardiology   Why:  at 7:45 AM for a stress test.  You will not walk for this test due to the EKG abnormality.     Contact information:   [address], [address] [place] [address] [phone]      Follow up with Vania Rea SUPPLE, MD.   Specialty:  Orthopedic Surgery   Why:  call to be seen in 10-14 days   Contact information:   [address] [place] [address] [phone]       DISCHARGE TO:  home   DISPOSITION: Good  DISCHARGE CONDITION:  Good   Reganne Messerschmidt for Dr. Francena Hanly 05/26/2016, 8:28 AM

## 2016-05-26 NOTE — Discharge Instructions (Signed)
For stress test July 8th do not eat or drink after midnight for the test.  This is [employer], we will place an IV and give medication and do a scan looking at resting phase of your heart.  Then you will have stress portion with a medicine that dilates your blood vessels as if you were walking on a treadmill.  You may have mild discomfort of chest pain, shortness of breath, or headache, but this clears in 3-4 minutes.  Then we scan you again comparing the rest with the stress looking for abnormalities.  The day before the test do not drink caffeine.  Do not wear cologne or strong deodorants to the test.  This is done at the [address] office.    You will follow up with Dr. Rennis Golden at the [facility] office.          Continue to ice and remain non weight bearing to left leg. Work on range of motion exercises as the therapist instructed. You can remove ace wrap and dressings on day 3 and shower. You can then keep an ace or light dressing over incisions. There is dermabond over the incision, leave that in place. Take OTC stool softener while on pain meds to prevent constipation  TAKE 1 Aspirin daily for 3 weeks!!!!

## 2016-05-26 NOTE — Care Management Note (Signed)
Case Management Note  Patient Details  Name: Harold Jacobs MRN: [id number] Date of Birth: 08-22-1971  Subjective/Objective:                    Action/Plan: Pt discharging home with self care. No further needs per CM.   Expected Discharge Date:                  Expected Discharge Plan:  Home/Self Care  In-House Referral:     Discharge planning Services     Post Acute Care Choice:    Choice offered to:     DME Arranged:    DME Agency:     HH Arranged:    HH Agency:     Status of Service:  Completed, signed off  If discussed at [employer] of Stay Meetings, dates discussed:    Additional Comments:  Kermit Balo, RN 05/26/2016, 2:56 PM

## 2016-05-26 NOTE — Evaluation (Signed)
Physical Therapy Evaluation Patient Details Name: Harold Jacobs MRN: [id number] DOB: 11/15/1970 Today's Date: 05/26/2016   History of Present Illness  45 y.o. male s/p wake boarding injury to left knee with  depressed lateral tibial plateau fracture which occured in late June/early July. Pt now s/p ORIF Lt lateral tibial plateau. Pt reports managing at home independently while awaiting surgery. PMH: anterior cervical surgery, LBBB, heart murmur, migraines.   Clinical Impression  Patient is s/p above surgery resulting in functional limitations due to the deficits listed below (see PT Problem List). Pt able to ambulate 75 feet and go up/down 2 stairs both with crutches.  Patient will benefit from skilled PT to increase their independence and safety with mobility to allow discharge to home with family and friends to assist. Pt denies any questions or concerns following session.        Follow Up Recommendations No PT follow up;Supervision - Intermittent    Equipment Recommendations  None recommended by PT    Recommendations for Other Services       Precautions / Restrictions Precautions Precautions: Fall;Knee Precaution Comments: gentle ROM Required Braces or Orthoses: Knee Immobilizer - Left Restrictions Weight Bearing Restrictions: Yes LLE Weight Bearing: Non weight bearing      Mobility  Bed Mobility Overal bed mobility: Independent             General bed mobility comments: supine<>sit  Transfers Overall transfer level: Needs assistance Equipment used: Crutches Transfers: Sit to/from Stand Sit to Stand: Supervision         General transfer comment: using crutches, reminder for placement of crutches with transfers.   Ambulation/Gait Ambulation/Gait assistance: Supervision Ambulation Distance (Feet): 75 Feet Assistive device: Crutches Gait Pattern/deviations:  (swing-to pattern) Gait velocity: decreased   General Gait Details: stable technique demonstrated,  no loss of balance. Consistent with NWB.  Stairs Stairs: Yes Stairs assistance: Min guard Stair Management: One rail Left;Step to pattern;Forwards;With crutches Number of Stairs: 2 General stair comments: Using rail and crutches to go up/down steps. Pt declines ambulating any more stairs or attempting without rails. Pt states that he feels safe with the stairs and his ability to enter his home.   Wheelchair Mobility    Modified Rankin (Stroke Patients Only)       Balance Overall balance assessment: Needs assistance Sitting-balance support: No upper extremity supported Sitting balance-Leahy Scale: Normal     Standing balance support: Single extremity supported Standing balance-Leahy Scale: Fair Standing balance comment: static                             Pertinent Vitals/Pain Pain Assessment: 0-10 Pain Score: 2  Pain Location: Lt leg Pain Descriptors / Indicators: Aching Pain Intervention(s): Limited activity within patient's tolerance;Monitored during session;Ice applied    Home Living Family/patient expects to be discharged to:: Private residence Living Arrangements: Spouse/significant other Available Help at Discharge: Family;Available 24 hours/day (parents will be staying with him) Type of Home: House Home Access: Stairs to enter Entrance Stairs-Rails: None Entrance Stairs-Number of Steps: 2 Home Layout: Two level Home Equipment: Crutches Additional Comments: lives with girlfreind who works. Describes that he has been bumping up/down the stairs at home. Planning to stay on the main level and sleep in the recliner initially.      Prior Function Level of Independence: Independent with assistive device(s)         Comments: using crutches, bumpin up/down stairs     Hand Dominance  Extremity/Trunk Assessment   Upper Extremity Assessment: Overall WFL for tasks assessed           Lower Extremity Assessment: LLE deficits/detail   LLE  Deficits / Details: assist with raising LLE due to pain  Cervical / Trunk Assessment: Normal  Communication   Communication: No difficulties  Cognition Arousal/Alertness: Awake/alert Behavior During Therapy: WFL for tasks assessed/performed Overall Cognitive Status: Within Functional Limits for tasks assessed                      General Comments      Exercises Other Exercises Other Exercises: gentle supported knee flexion/extension, pt educated on techinque. Other Exercises: ankle pumps Other Exercises: lt ankle dorsiflexion stretch      Assessment/Plan    PT Assessment Patient needs continued PT services  PT Diagnosis Difficulty walking   PT Problem List Decreased strength;Decreased range of motion;Decreased activity tolerance;Decreased balance;Decreased mobility  PT Treatment Interventions DME instruction;Gait training;Stair training;Functional mobility training;Therapeutic activities;Therapeutic exercise;Patient/family education   PT Goals (Current goals can be found in the Care Plan section) Acute Rehab PT Goals Patient Stated Goal: go home PT Goal Formulation: With patient Time For Goal Achievement: 06/02/16 Potential to Achieve Goals: Good    Frequency Min 5X/week   Barriers to discharge        Co-evaluation               End of Session Equipment Utilized During Treatment: Gait belt;Left knee immobilizer Activity Tolerance: Patient tolerated treatment well (reports nausea during session) Patient left: in bed;with call bell/phone within reach Nurse Communication: Mobility status         Time: 1610-9604 PT Time Calculation (min) (ACUTE ONLY): 51 min   Charges:   PT Evaluation $PT Eval Moderate Complexity: 1 Procedure PT Treatments $Gait Training: 8-22 mins $Therapeutic Exercise: 8-22 mins   PT G Codes:        Christiane Ha, PT, CSCS Pager [phone] Office [phone]  05/26/2016, 2:43 PM

## 2016-05-26 NOTE — Op Note (Signed)
NAMECIMARRON, PUFAHL NO.:  [id number]  MEDICAL RECORD NO.:  [id number]  LOCATION:  5N15C                        FACILITY:  [facility]  PHYSICIAN:  Vania Rea. Tenzin Edelman, M.D.  DATE OF BIRTH:  Apr 17, 1971  DATE OF PROCEDURE:  05/25/2016 DATE OF DISCHARGE:                              OPERATIVE REPORT   PREOPERATIVE DIAGNOSIS:  Impacted displaced left lateral tibial plateau fracture.  POSTOPERATIVE DIAGNOSIS:  Impacted displaced left lateral tibial plateau fracture.  PROCEDURE:  Open reduction and internal fixation of left lateral tibial plateau fracture with allograft bone grafting of the metaphyseal defect.  SURGEON:  Francena Hanly, M.D.  ASSISTANT:  French Ana A. Shuford, PA-C.  ANESTHESIA:  Regional with IV sedation.  ESTIMATED BLOOD LOSS:  Minimal.  DRAINS:  None.  TOURNIQUET TIME:  1 hour and 15 minutes.  HISTORY:  Mr. Defina is a 45 year old gentleman who injured his left knee while wakeboarding sustaining what appears to have been a valgus loading injury with immediate complaints of severe pain and swelling. He was evaluated in our office where a large lipohemarthrosis was evacuated.  Plain radiographs showed questionable injury of lateral tibial plateau.  Subsequent MRI scan confirmed that there was a significant impaction defect at the anterolateral articular surface with some comminution and displacement.  The degree of depression was significant with cortical violation and given the degree of impaction deformity and potential for further deformity of the articular surface, I have discussed with Mr. Chadick treatment options, potential risks versus benefits thereof.  Possible surgical complications of bleeding, infection, neurovascular injury, DVT, PE, persistent pain, loss of motion, anesthetic complication, possible need for additional surgery, and potential for posttraumatic arthritis were all reviewed.  It is our feeling that by improving the  overall alignment of the articular surface that we could potentially lower his risk for posttraumatic arthritis. He understands and accepts and agrees with plan for ORIF.  PROCEDURE IN DETAIL:  After undergoing extensive preop evaluation which did include a Cardiology evaluation, he had a preoperative EKG which showed evidence for left bundle-branch block.  Subsequent echocardiogram was performed, which overall was felt to show relatively normal cardiac function.  Cardiologist did suggest followup postoperatively.  He does not feel that there is any reason to postpone the surgery for further cardiac workup.  We did utilize strictly a regional anesthetic in recognition of the left bundle-branch block.  For this reason, then in the holding area, the anesthesiologist placed a combination of femoral nerve block and popliteal block.  The patient was then brought to the operating room, placed supine on the operative table and, did receive some supplemental IV sedation.  A tourniquet was then applied to the left thigh.  The left leg was sterilely prepped and draped in standard fashion.  Time-out was called.  The left lower extremity was exsanguinated with the tourniquet inflated to 350 mmHg.  I made an anterolateral hockey-stick incision along the tibial crest and then traversing posteriorly along the margin at the lateral tibial plateau. Skin was elevated as a single flap and then dissection carried deeply with the deep fascia divided in line with skin incision and elevated the anterior compartment musculature away from the proximal lateral tibial  cortex and then divided posteriorly and completed our dissection dividing the iliotibial band and then deeper tissues and reflecting the tissues on a posterior pedicle and then divided through the meniscotibial ligaments for later repair.  Once this exposure was completed, I had direct visualization of the fracture site and could also easily see the  articular surface which did indeed show a significant depression of the anterolateral portion of the lateral tibial plateau.  I then manipulated the fracture site and was able to gain access into the metaphyseal region where the bone had been significantly depressed and used a series of bone tamps to elevate the articular surfaces and compact the cancellous bone in the region, recreating the normal contour of the lateral tibial plateau.  I then utilized 20 mL of cortical cancellous chips as well as a vial of demineralized bone matrix and mixed this into a slurry and ended up using approximately 15 mL of this construct and impacted this into the metaphyseal region to help this to further prop up and re-contour the normal concavity of the lateral tibial plateau.  Then I selected the appropriate plate from the [employer] tibial plate tray and this was provisionally placed over the lateral aspect of the proximal tibia.  We then used the "[employer]" to allow [place] to create compression through the plate and compressing the fracture site and placed a provisional pin to hold our plate in position and then at this point, fluoroscopic imaging was then used to confirm that the overall position and alignment was to our satisfaction.  I then transfixed the most distal aspect of our plate into the tibial shaft with 3.5 screws and then placed a series of compression screws through the plate proximally in the subchondral bone region, all obtaining excellent fit, fixation, and compression of the fracture site and maintenance of our reduction and excellent support of the subchondral bone.  We completed the fixation of the plate, all screws obtaining excellent bony purchase.  Final fluoroscopic images were then obtained which showed good alignment at the fracture site, good position of the hardware.  The wound was then copiously irrigated. I then repaired the lateral meniscus with a series of woven  polyester sutures through the rim of the meniscus, recreating the meniscotibial ligament and repairing the sutures back down through the islets on the superior aspect of our plate.  The lateral capsule and the iliotibial band were then repaired using a combination of the deep sutures that we had passed through the plate to elevate the distal aspect of the IT band and then performed an end-to-end repair of the IT band with a series of figure-of-eight #1 Vicryl sutures.  The figure-of-eight Vicryl was then used to close the fascial layer anteriorly along the tibial crest.  At this point, the tourniquet was then let down.  Confirmed there was appropriate hemostasis.  The subcu layer was closed with 2-0 Vicryl, intracuticular 3-0 Monocryl for the skin, followed by Steri-Strips, dry dressing.  Left lower extremity was placed into a knee immobilizer.  The patient was then awakened and transferred to the recovery room in stable condition.     Vania Rea. Keison Glendinning, M.D.     KMS/MEDQ  D:  05/25/2016  T:  05/25/2016  Job:  [id number]

## 2016-05-26 NOTE — Plan of Care (Signed)
Problem: Pain Management: Goal: Pain level will decrease Outcome: Not Progressing Patient continues to have some pain, he has been medicated twice for pain with moderate pain relief

## 2016-05-30 NOTE — Addendum Note (Signed)
Addendum  created 05/30/16 1470 by Adair Laundry, CRNA   Modules edited: Anesthesia Responsible Staff

## 2016-06-14 ENCOUNTER — Telehealth (HOSPITAL_COMMUNITY): Payer: Self-pay | Admitting: *Deleted

## 2016-06-14 NOTE — Telephone Encounter (Signed)
Patient given detailed instructions per Myocardial Perfusion Study Information Sheet for the test on 06/20/16 at 0745. Patient notified to arrive 15 minutes early and that it is imperative to arrive on time for appointment to keep from having the test rescheduled.  If you need to cancel or reschedule your appointment, please call the office within 24 hours of your appointment. Failure to do so may result in a cancellation of your appointment, and a $50 no show fee. Patient verbalized understanding.Hajira Verhagen, Adelene Idler

## 2016-06-20 ENCOUNTER — Encounter (INDEPENDENT_AMBULATORY_CARE_PROVIDER_SITE_OTHER): Payer: Self-pay

## 2016-06-20 ENCOUNTER — Ambulatory Visit (HOSPITAL_COMMUNITY): Payer: BLUE CROSS/BLUE SHIELD | Attending: Cardiology

## 2016-06-20 DIAGNOSIS — I517 Cardiomegaly: Secondary | ICD-10-CM | POA: Diagnosis not present

## 2016-06-20 DIAGNOSIS — I519 Heart disease, unspecified: Secondary | ICD-10-CM

## 2016-06-20 DIAGNOSIS — R0602 Shortness of breath: Secondary | ICD-10-CM | POA: Diagnosis not present

## 2016-06-20 DIAGNOSIS — I447 Left bundle-branch block, unspecified: Secondary | ICD-10-CM | POA: Diagnosis present

## 2016-06-20 DIAGNOSIS — R9439 Abnormal result of other cardiovascular function study: Secondary | ICD-10-CM | POA: Insufficient documentation

## 2016-06-20 LAB — MYOCARDIAL PERFUSION IMAGING
CHL CUP NUCLEAR SDS: 3
CHL CUP RESTING HR STRESS: 65 {beats}/min
LHR: 0.26
LV dias vol: 205 mL (ref 62–150)
LVSYSVOL: 127 mL
NUC STRESS TID: 1.13
Peak HR: 99 {beats}/min
SRS: 8
SSS: 11

## 2016-06-20 MED ORDER — TECHNETIUM TC 99M TETROFOSMIN IV KIT
11.0000 | PACK | Freq: Once | INTRAVENOUS | Status: AC | PRN
  Administered 2016-06-20: 11 via INTRAVENOUS
  Filled 2016-06-20: qty 11

## 2016-06-20 MED ORDER — REGADENOSON 0.4 MG/5ML IV SOLN
0.4000 mg | Freq: Once | INTRAVENOUS | Status: AC
  Administered 2016-06-20: 0.4 mg via INTRAVENOUS

## 2016-06-20 MED ORDER — TECHNETIUM TC 99M TETROFOSMIN IV KIT
31.7000 | PACK | Freq: Once | INTRAVENOUS | Status: AC | PRN
  Administered 2016-06-20: 31.7 via INTRAVENOUS
  Filled 2016-06-20: qty 32

## 2016-06-29 ENCOUNTER — Ambulatory Visit (INDEPENDENT_AMBULATORY_CARE_PROVIDER_SITE_OTHER): Payer: BLUE CROSS/BLUE SHIELD | Admitting: Internal Medicine

## 2016-06-29 ENCOUNTER — Encounter: Payer: Self-pay | Admitting: Internal Medicine

## 2016-06-29 VITALS — BP 125/77 | HR 79 | Ht 71.5 in | Wt 199.0 lb

## 2016-06-29 DIAGNOSIS — I429 Cardiomyopathy, unspecified: Secondary | ICD-10-CM | POA: Diagnosis not present

## 2016-06-29 DIAGNOSIS — I447 Left bundle-branch block, unspecified: Secondary | ICD-10-CM | POA: Diagnosis not present

## 2016-06-29 DIAGNOSIS — I428 Other cardiomyopathies: Secondary | ICD-10-CM

## 2016-06-29 DIAGNOSIS — Z79899 Other long term (current) drug therapy: Secondary | ICD-10-CM

## 2016-06-29 DIAGNOSIS — S82142S Displaced bicondylar fracture of left tibia, sequela: Secondary | ICD-10-CM | POA: Diagnosis not present

## 2016-06-29 MED ORDER — LOSARTAN POTASSIUM 25 MG PO TABS: 25.0000 mg | ORAL_TABLET | Freq: Every day | ORAL | 3 refills | Status: DC

## 2016-06-29 NOTE — Progress Notes (Signed)
OFFICE NOTE  Chief Complaint:  Hospital follow-up  Primary Care Physician: Harold HolidayAVIS,JOHN F, MD  HPI:  Harold Jacobs is a 45 y.o. male who was recently seen during a hospitalization for elective left knee surgery. Unfortunately he suffered a fracture while wake boarding. Prior to surgery he had an EKG which demonstrated a left bundle branch block and we were urgently consult it for preoperative clearance. He reports being totally asymptomatic he denied any chest pain or worsening shortness of breath. He exercises "very heavily", in fact, since surgery he continues to do pushups and upper body strength exercises although he cannot use his left knee to great extents. Prior to surgery he had an echocardiogram which showed a reduced EF of 45-50% and global hypokinesis. Due to his relative lack of symptoms, I recommended going ahead with surgery. He underwent surgery without any complications. Subsequently he had postoperative outpatient nuclear stress testing which indicated an EF of 38% however no ischemia was noted. I believe EF is slightly lower than the echo based on the fact that there is likely a gating abnormality due to left bundle branch block. We had a long discussion about the etiology of his left bundle branch block and a possible etiologies of his cardiomyopathy. It could be that the left bundle branch block ounces heart failure or that his cardiomyopathy is associated with left bundle branch block. QRS duration, by the way is less than 150 ms and his LVEF is greater than 35%. As he has class I symptoms, based on current guidelines, CRT therapy is not indicated.  PMHx:  Past Medical History:  Diagnosis Date  . Family history of anesthesia complication    "Mom, PONV" (05/07/2013)  . Heart murmur   . LBBB (left bundle branch block) 05/25/2016  . Migraine    "none in many years" (05/25/2016)  . Numbness    Hx: of left arm/hand "preop from my neck"  . PONV (postoperative nausea and  vomiting)     Past Surgical History:  Procedure Laterality Date  . ANTERIOR CERVICAL DECOMP/DISCECTOMY FUSION N/A 05/07/2013   Procedure: ANTERIOR CERVICAL DISCECTOMY FUSION   (ACDF C5-7 ) (2 LEVELS) ;  Surgeon: Venita Lickahari Brooks, MD;  [facility];  Service: Orthopedics;  Laterality: N/A;  cervical five-six, six-seven anterior cervical discectomy and fusion  . BACK SURGERY    . CYST REMOVAL NECK Right 1990  . FRACTURE SURGERY    . NASAL FRACTURE SURGERY  1984  . ORIF PROXIMAL TIBIAL PLATEAU FRACTURE Left 05/25/2016  . ORIF TIBIA PLATEAU Left 05/25/2016   Procedure: OPEN REDUCTION INTERNAL FIXATION (ORIF)  LEFT TIBIAL PLATEAU;  Surgeon: Francena HanlyKevin Supple, MD;  Location: [facility] OR;  Service: Orthopedics;  Laterality: Left;  . REFRACTIVE SURGERY  ~ 2004  . TONSILLECTOMY  1976  . WRIST FRACTURE SURGERY Left 1997    FAMHx:  Family History  Problem Relation Age of Onset  . Hypertension Mother   . Hypertension Father   . Cancer - Prostate Father     SOCHx:   reports that he has never smoked. He has never used smokeless tobacco. He reports that he drinks about 5.4 oz of alcohol per week . He reports that he does not use drugs.  ALLERGIES:  Allergies  Allergen Reactions  . No Known Allergies     ROS: Pertinent items noted in HPI and remainder of comprehensive ROS otherwise negative.  HOME MEDS: Current Outpatient Prescriptions  Medication Sig Dispense Refill  . NON FORMULARY daily. [employer] Multivitamin/Multipack    .  losartan (COZAAR) 25 MG tablet Take 1 tablet (25 mg total) by mouth daily. 90 tablet 3   No current facility-administered medications for this visit.     LABS/IMAGING: No results found for this or any previous visit (from the past 48 hour(s)). No results found.  WEIGHTS: Wt Readings from Last 3 Encounters:  06/29/16 199 lb (90.3 kg)  06/20/16 200 lb (90.7 kg)  05/25/16 200 lb (90.7 kg)    VITALS: BP 125/77   Pulse 79   Ht 5' 11.5" (1.816 m)   Wt 199 lb (90.3 kg)    BMI 27.37 kg/m   EXAM: General appearance: alert and no distress Neck: no carotid bruit and no JVD Lungs: clear to auscultation bilaterally Heart: regular rate and rhythm, S1, S2 normal, no murmur, click, rub or gallop Abdomen: soft, non-tender; bowel sounds normal; no masses,  no organomegaly Extremities: extremities normal, atraumatic, no cyanosis or edema Pulses: 2+ and symmetric Skin: Skin color, texture, turgor normal. No rashes or lesions Neurologic: Grossly normal Psych: Pleasant  EKG: Deferred  ASSESSMENT: 1. LBBB 2. 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 Recent left tibial plateau fracture  PLAN: 1.   [name] has what appears to be a nonischemic cardio myopathy an EF of 45% although was slightly lower at 38% on nuclear stress testing, but this may be due to a gating abnormality. It's possible the left bundle branch block could have led to his cardiomyopathy. He continues to be completely asymptomatic, exercising pretty vigorously despite his recent surgery. Based on this, he is not a candidate for CRT therapy and I would recommend medical therapy including the addition of low-dose ARB as blood pressure tolerates for heart failure. Guidelines also recommend low-dose 81 mg daily aspirin. We'll start these today and plan follow-up in 6 months with an echo at that time.  Chrystie Nose, MD, [facility] Attending Cardiologist [facility]  Chrystie Nose 06/29/2016, 1:51 PM

## 2016-06-29 NOTE — Patient Instructions (Addendum)
Medication Instructions:   START losartan 25mg  once daily  Labwork:  BMET next week - you can have this done at your PCP office Please have results sent to Dr. Rennis Golden - fax: [phone]  Testing/Procedures:  Your physician has requested that you have a limited echocardiogram in 6 months prior to your next visit. This is done at [address] - [address]rd Floor. Echocardiography is a painless test that uses sound waves to create images of your heart. It provides your doctor with information about the size and shape of your heart and how well your heart's chambers and valves are working. This procedure takes approximately one hour. There are no restrictions for this procedure.  Follow-Up:  Your physician wants you to follow-up in: 6 months with Dr. Rennis Golden. You will receive a reminder letter in the mail two months in advance. If you don't receive a letter, please call our office to schedule the follow-up appointment.  Any Other Special Instructions Will Be Listed Below (If Applicable).

## 2016-07-07 ENCOUNTER — Encounter: Payer: Self-pay | Admitting: Internal Medicine

## 2016-12-06 ENCOUNTER — Encounter (HOSPITAL_COMMUNITY): Payer: Self-pay | Admitting: Radiology

## 2016-12-06 ENCOUNTER — Telehealth (HOSPITAL_COMMUNITY): Payer: Self-pay | Admitting: Radiology

## 2016-12-06 NOTE — Telephone Encounter (Signed)
called to schedule echocardiogram 

## 2016-12-27 ENCOUNTER — Other Ambulatory Visit: Payer: Self-pay

## 2016-12-27 ENCOUNTER — Ambulatory Visit (HOSPITAL_COMMUNITY): Payer: BLUE CROSS/BLUE SHIELD | Attending: Cardiology

## 2016-12-27 DIAGNOSIS — I428 Other cardiomyopathies: Secondary | ICD-10-CM

## 2016-12-27 DIAGNOSIS — I517 Cardiomegaly: Secondary | ICD-10-CM | POA: Diagnosis not present

## 2016-12-27 DIAGNOSIS — R011 Cardiac murmur, unspecified: Secondary | ICD-10-CM | POA: Insufficient documentation

## 2016-12-27 DIAGNOSIS — I429 Cardiomyopathy, unspecified: Secondary | ICD-10-CM | POA: Diagnosis present

## 2016-12-27 DIAGNOSIS — I447 Left bundle-branch block, unspecified: Secondary | ICD-10-CM | POA: Diagnosis not present

## 2017-01-15 ENCOUNTER — Ambulatory Visit (INDEPENDENT_AMBULATORY_CARE_PROVIDER_SITE_OTHER): Payer: BLUE CROSS/BLUE SHIELD | Admitting: Internal Medicine

## 2017-01-15 ENCOUNTER — Encounter: Payer: Self-pay | Admitting: Internal Medicine

## 2017-01-15 VITALS — BP 110/72 | HR 88 | Ht 72.0 in | Wt 215.0 lb

## 2017-01-15 DIAGNOSIS — I447 Left bundle-branch block, unspecified: Secondary | ICD-10-CM | POA: Diagnosis not present

## 2017-01-15 DIAGNOSIS — I428 Other cardiomyopathies: Secondary | ICD-10-CM | POA: Diagnosis not present

## 2017-01-15 MED ORDER — LOSARTAN POTASSIUM 25 MG PO TABS
25.0000 mg | ORAL_TABLET | Freq: Every day | ORAL | 3 refills | Status: DC
Start: 2017-01-15 — End: 2017-12-20

## 2017-01-15 NOTE — Patient Instructions (Addendum)
Your physician wants you to follow-up in: ONE YEAR with Dr. Hilty (echo prior) You will receive a reminder letter in the mail two months in advance. If you don't receive a letter, please call our office to schedule the follow-up appointment.  

## 2017-01-15 NOTE — Progress Notes (Signed)
OFFICE NOTE  Chief Complaint:  Hospital follow-up  Primary Care Physician: Betsey Holiday, MD  HPI:  Harold Jacobs is a 46 y.o. male who was recently seen during a hospitalization for elective left knee surgery. Unfortunately he suffered a fracture while wake boarding. Prior to surgery he had an EKG which demonstrated a left bundle branch block and we were urgently consult it for preoperative clearance. He reports being totally asymptomatic he denied any chest pain or worsening shortness of breath. He exercises "very heavily", in fact, since surgery he continues to do pushups and upper body strength exercises although he cannot use his left knee to great extents. Prior to surgery he had an echocardiogram which showed a reduced EF of 45-50% and global hypokinesis. Due to his relative lack of symptoms, I recommended going ahead with surgery. He underwent surgery without any complications. Subsequently he had postoperative outpatient nuclear stress testing which indicated an EF of 38% however no ischemia was noted. I believe EF is slightly lower than the echo based on the fact that there is likely a gating abnormality due to left bundle branch block. We had a long discussion about the etiology of his left bundle branch block and a possible etiologies of his cardiomyopathy. It could be that the left bundle branch block ounces heart failure or that his cardiomyopathy is associated with left bundle branch block. QRS duration, by the way is less than 150 ms and his LVEF is greater than 35%. As he has class I symptoms, based on current guidelines, CRT therapy is not indicated.  01/15/2017  Mr. Hoskinson returns today for follow-up. He continues to struggle to try to recover from his left tibial plateau fracture. He is working with sports medicine doctors due to poor range of motion in his left knee. From a heart standpoint, he is EF prior to surgery showed 45-50% and subsequently nuclear testing showed an  EF of 38%. He has been started on low-dose losartan 25 mg and blood pressure will not allow increases in this. He seems to tolerate it and continues to have NYHA class I symptoms. EF now is 45-50% again which is either unchanged or perhaps improved.   PMHx:  Past Medical History:  Diagnosis Date  . Family history of anesthesia complication    "Mom, PONV" (05/07/2013)  . Heart murmur   . LBBB (left bundle branch block) 05/25/2016  . Migraine    "none in many years" (05/25/2016)  . Numbness    Hx: of left arm/hand "preop from my neck"  . PONV (postoperative nausea and vomiting)     Past Surgical History:  Procedure Laterality Date  . ANTERIOR CERVICAL DECOMP/DISCECTOMY FUSION N/A 05/07/2013   Procedure: ANTERIOR CERVICAL DISCECTOMY FUSION   (ACDF C5-7 ) (2 LEVELS) ;  Surgeon: Venita Lick, MD;  Location: [facility] OR;  Service: Orthopedics;  Laterality: N/A;  cervical five-six, six-seven anterior cervical discectomy and fusion  . BACK SURGERY    . CYST REMOVAL NECK Right 1990  . FRACTURE SURGERY    . NASAL FRACTURE SURGERY  1984  . ORIF PROXIMAL TIBIAL PLATEAU FRACTURE Left 05/25/2016  . ORIF TIBIA PLATEAU Left 05/25/2016   Procedure: OPEN REDUCTION INTERNAL FIXATION (ORIF)  LEFT TIBIAL PLATEAU;  Surgeon: Francena Hanly, MD;  Location: [facility] OR;  Service: Orthopedics;  Laterality: Left;  . REFRACTIVE SURGERY  ~ 2004  . TONSILLECTOMY  1976  . WRIST FRACTURE SURGERY Left 1997    FAMHx:  Family History  Problem Relation Age  of Onset  . Hypertension Mother   . Hypertension Father   . Cancer - Prostate Father     SOCHx:   reports that he has never smoked. He has never used smokeless tobacco. He reports that he drinks about 5.4 oz of alcohol per week . He reports that he does not use drugs.  ALLERGIES:  Allergies  Allergen Reactions  . No Known Allergies     ROS: Pertinent items noted in HPI and remainder of comprehensive ROS otherwise negative.  HOME MEDS: Current Outpatient  Prescriptions  Medication Sig Dispense Refill  . NON FORMULARY daily. [employer] Multivitamin/Multipack    . losartan (COZAAR) 25 MG tablet Take 1 tablet (25 mg total) by mouth daily. 90 tablet 3   No current facility-administered medications for this visit.     LABS/IMAGING: No results found for this or any previous visit (from the past 48 hour(s)). No results found.  WEIGHTS: Wt Readings from Last 3 Encounters:  01/15/17 215 lb (97.5 kg)  06/29/16 199 lb (90.3 kg)  06/20/16 200 lb (90.7 kg)    VITALS: BP 110/72   Pulse 88   Ht 6' (1.829 m)   Wt 215 lb (97.5 kg)   BMI 29.16 kg/m   EXAM: General appearance: alert and no distress Neck: no carotid bruit and no JVD Lungs: clear to auscultation bilaterally Heart: regular rate and rhythm, S1, S2 normal, no murmur, click, rub or gallop Abdomen: soft, non-tender; bowel sounds normal; no masses,  no organomegaly Extremities: extremities normal, atraumatic, no cyanosis or edema Pulses: 2+ and symmetric Skin: Skin color, texture, turgor normal. No rashes or lesions Neurologic: Grossly normal Psych: Pleasant  EKG: Normal sinus rhythm 88, LBBB  ASSESSMENT: 1. LBBB 2. 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   [name] continues to have class I NYHA symptoms. He has an EF of 45-50% which is possibly improved or stable on low-dose losartan. Blood pressure will not allow increases. Given his class I symptoms, there is not an indication for Entresto. I'm hesitant to add beta blocker due to his significant conduction delay with QRS the greater than 160 ms.  Follow-up annually or sooner as necessary.  Chrystie Nose, MD, [facility] Attending Cardiologist [facility]  Chrystie Nose 01/15/2017, 8:44 AM

## 2017-12-20 ENCOUNTER — Telehealth: Payer: Self-pay | Admitting: Internal Medicine

## 2017-12-20 DIAGNOSIS — I428 Other cardiomyopathies: Secondary | ICD-10-CM

## 2017-12-20 MED ORDER — LOSARTAN POTASSIUM 25 MG PO TABS: 25.0000 mg | ORAL_TABLET | Freq: Every day | ORAL | 0 refills | Status: DC

## 2017-12-20 NOTE — Telephone Encounter (Signed)
Pt c/o medication issue:  1. Name of Medication: Losartan  2. How are you currently taking this medication (dosage and times per day)?  25 mg, 1 tablet by mouth   3. Are you having a reaction (difficulty breathing--STAT)? no  4. What is your medication issue? Patient would like to verify if he still needs this medication

## 2017-12-20 NOTE — Telephone Encounter (Signed)
Patient stated he needed a refill on Losartan. Sent in refill for patient to pick up at [facility] in [facility]. Patient was not sure if he was suppose to continue the Losartan. Informed patient that yes he needs to continue medications, and he could discuss with Dr. Rennis Golden about stopping at his appointment in May. Patient verbalized understanding.

## 2018-01-25 ENCOUNTER — Telehealth (HOSPITAL_COMMUNITY): Payer: Self-pay | Admitting: Internal Medicine

## 2018-01-28 ENCOUNTER — Other Ambulatory Visit (HOSPITAL_COMMUNITY): Payer: BLUE CROSS/BLUE SHIELD

## 2018-01-29 NOTE — Telephone Encounter (Signed)
User: Trina Ao A Date/time: 01/25/18 2:36 PM  Comment: Called pt and lmsg for him to CB to get r/s for an echo on 3/18 due to tech being out sick.Edmonia Caprio  Context:  Outcome: Left Message  Phone number: [phone] Phone Type: Home Phone  Comm. type: Telephone Call type: Outgoing  Contact: Esmeralda Links Relation to patient: Self

## 2018-02-06 ENCOUNTER — Other Ambulatory Visit: Payer: Self-pay

## 2018-02-06 ENCOUNTER — Ambulatory Visit (HOSPITAL_COMMUNITY): Payer: BLUE CROSS/BLUE SHIELD | Attending: Cardiology

## 2018-02-06 DIAGNOSIS — I428 Other cardiomyopathies: Secondary | ICD-10-CM | POA: Diagnosis not present

## 2018-02-06 DIAGNOSIS — I447 Left bundle-branch block, unspecified: Secondary | ICD-10-CM | POA: Insufficient documentation

## 2018-02-06 DIAGNOSIS — R011 Cardiac murmur, unspecified: Secondary | ICD-10-CM | POA: Insufficient documentation

## 2018-03-19 ENCOUNTER — Ambulatory Visit: Payer: BLUE CROSS/BLUE SHIELD | Admitting: Internal Medicine

## 2018-03-19 ENCOUNTER — Other Ambulatory Visit: Payer: Self-pay | Admitting: Internal Medicine

## 2018-03-19 ENCOUNTER — Encounter: Payer: Self-pay | Admitting: Internal Medicine

## 2018-03-19 VITALS — BP 124/74 | HR 60 | Ht 72.0 in | Wt 216.8 lb

## 2018-03-19 DIAGNOSIS — I447 Left bundle-branch block, unspecified: Secondary | ICD-10-CM

## 2018-03-19 DIAGNOSIS — I428 Other cardiomyopathies: Secondary | ICD-10-CM

## 2018-03-19 DIAGNOSIS — I517 Cardiomegaly: Secondary | ICD-10-CM | POA: Diagnosis not present

## 2018-03-19 MED ORDER — LOSARTAN POTASSIUM 25 MG PO TABS: 25.0000 mg | ORAL_TABLET | Freq: Every day | ORAL | 3 refills | Status: DC

## 2018-03-19 NOTE — Patient Instructions (Signed)
Your physician wants you to follow-up in: ONE YEAR with Dr. Hilty. You will receive a reminder letter in the mail two months in advance. If you don't receive a letter, please call our office to schedule the follow-up appointment.  

## 2018-03-20 ENCOUNTER — Encounter: Payer: Self-pay | Admitting: Internal Medicine

## 2018-03-20 DIAGNOSIS — I517 Cardiomegaly: Secondary | ICD-10-CM | POA: Insufficient documentation

## 2018-03-20 NOTE — Progress Notes (Signed)
OFFICE NOTE  Chief Complaint:  Routine follow-up  Primary Care Physician: Betsey Holiday, MD  HPI:  Harold Jacobs is a 47 y.o. male who was recently seen during a hospitalization for elective left knee surgery. Unfortunately he suffered a fracture while wake boarding. Prior to surgery he had an EKG which demonstrated a left bundle branch block and we were urgently consult it for preoperative clearance. He reports being totally asymptomatic he denied any chest pain or worsening shortness of breath. He exercises "very heavily", in fact, since surgery he continues to do pushups and upper body strength exercises although he cannot use his left knee to great extents. Prior to surgery he had an echocardiogram which showed a reduced EF of 45-50% and global hypokinesis. Due to his relative lack of symptoms, I recommended going ahead with surgery. He underwent surgery without any complications. Subsequently he had postoperative outpatient nuclear stress testing which indicated an EF of 38% however no ischemia was noted. I believe EF is slightly lower than the echo based on the fact that there is likely a gating abnormality due to left bundle branch block. We had a long discussion about the etiology of his left bundle branch block and a possible etiologies of his cardiomyopathy. It could be that the left bundle branch block ounces heart failure or that his cardiomyopathy is associated with left bundle branch block. QRS duration, by the way is less than 150 ms and his LVEF is greater than 35%. As he has class I symptoms, based on current guidelines, CRT therapy is not indicated.  01/15/2017  Harold Jacobs returns today for follow-up. He continues to struggle to try to recover from his left tibial plateau fracture. He is working with sports medicine doctors due to poor range of motion in his left knee. From a heart standpoint, he is EF prior to surgery showed 45-50% and subsequently nuclear testing showed an  EF of 38%. He has been started on low-dose losartan 25 mg and blood pressure will not allow increases in this. He seems to tolerate it and continues to have NYHA class I symptoms. EF now is 45-50% again which is either unchanged or perhaps improved.   03/20/2018  Harold Jacobs was seen today in follow-up.  Is been a year since I last saw him.  Overall he reports doing well.  He denies any chest pain or worsening shortness of breath.  He just underwent a repeat echocardiogram which I am pleased to report demonstrates improvement in LVEF up to 55 to 60%, although there is stage I diastolic dysfunction.  It was noted that he has severe LV wall thickening.  Although I personally reviewed the echo, I re-reviewed the echo images personally today, and the LV does appear to be thickened, measuring up to 1.7 cm.  He has normal left atrial size and this is certainly concerning for possible hypertrophic cardiomyopathy.  PMHx:  Past Medical History:  Diagnosis Date  . Family history of anesthesia complication    "Mom, PONV" (05/07/2013)  . Heart murmur   . LBBB (left bundle branch block) 05/25/2016  . Migraine    "none in many years" (05/25/2016)  . Numbness    Hx: of left arm/hand "preop from my neck"  . PONV (postoperative nausea and vomiting)     Past Surgical History:  Procedure Laterality Date  . ANTERIOR CERVICAL DECOMP/DISCECTOMY FUSION N/A 05/07/2013   Procedure: ANTERIOR CERVICAL DISCECTOMY FUSION   (ACDF C5-7 ) (2 LEVELS) ;  Surgeon:  Venita Lick, MD;  Location: [facility] OR;  Service: Orthopedics;  Laterality: N/A;  cervical five-six, six-seven anterior cervical discectomy and fusion  . BACK SURGERY    . CYST REMOVAL NECK Right 1990  . FRACTURE SURGERY    . NASAL FRACTURE SURGERY  1984  . ORIF PROXIMAL TIBIAL PLATEAU FRACTURE Left 05/25/2016  . ORIF TIBIA PLATEAU Left 05/25/2016   Procedure: OPEN REDUCTION INTERNAL FIXATION (ORIF)  LEFT TIBIAL PLATEAU;  Surgeon: Francena Hanly, MD;  Location: [facility] OR;   Service: Orthopedics;  Laterality: Left;  . REFRACTIVE SURGERY  ~ 2004  . TONSILLECTOMY  1976  . WRIST FRACTURE SURGERY Left 1997    FAMHx:  Family History  Problem Relation Age of Onset  . Hypertension Mother   . Hypertension Father   . Cancer - Prostate Father     SOCHx:   reports that he has never smoked. He has never used smokeless tobacco. He reports that he drinks about 5.4 oz of alcohol per week. He reports that he does not use drugs.  ALLERGIES:  No Known Allergies  ROS: Pertinent items noted in HPI and remainder of comprehensive ROS otherwise negative.  HOME MEDS: Current Outpatient Medications  Medication Sig Dispense Refill  . NON FORMULARY daily. [employer] Multivitamin/Multipack    . losartan (COZAAR) 25 MG tablet Take 1 tablet (25 mg total) by mouth daily. 90 tablet 3   No current facility-administered medications for this visit.     LABS/IMAGING: No results found for this or any previous visit (from the past 48 hour(s)). No results found.  WEIGHTS: Wt Readings from Last 3 Encounters:  03/19/18 216 lb 12.8 oz (98.3 kg)  01/15/17 215 lb (97.5 kg)  06/29/16 199 lb (90.3 kg)    VITALS: BP 124/74   Pulse 60   Ht 6' (1.829 m)   Wt 216 lb 12.8 oz (98.3 kg)   BMI 29.40 kg/m   EXAM: General appearance: alert and no distress Neck: no carotid bruit and no JVD Lungs: clear to auscultation bilaterally Heart: regular rate and rhythm, S1, S2 normal, no murmur, click, rub or gallop Abdomen: soft, non-tender; bowel sounds normal; no masses,  no organomegaly Extremities: extremities normal, atraumatic, no cyanosis or edema Pulses: 2+ and symmetric Skin: Skin color, texture, turgor normal. No rashes or lesions Neurologic: Grossly normal Psych: Pleasant  EKG: Normal sinus rhythm at 60, LBBB (QRSD-172 ms)-personally reviewed  ASSESSMENT: 1. LBBB 2. 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 ?severe  LVH-measures 1.7 cm  PLAN: 1.   Harold Jacobs remains asymptomatic.  His LVEF has improved to 55 to 60%, however he has severe LVH by measurement.  There is no evidence of left atrial enlargement and he has no history of significant hypertension.  This is somewhat concerning for hypertrophic cardiomyopathy.  We discussed the possibility of obtaining a cardiac MRI to further search for features consistent with hypertrophic cardiomyopathy and to more definitively measure wall thickness, LV mass and other markers of HCM.  We will pursue cardiac MRI and plan follow-up with him afterwards.  Chrystie Nose, MD, [facility], FACP  [place]  [facility]  Medical Director of the Advanced Lipid Disorders &  Cardiovascular Risk Reduction Clinic Diplomate of the American Board of Clinical Lipidology Attending Cardiologist  Direct Dial: [phone]  Fax: [phone]  Website:  [email or web address] Nicely Hilty 03/20/2018, 6:28 PM

## 2018-03-21 ENCOUNTER — Telehealth: Payer: Self-pay | Admitting: Internal Medicine

## 2018-03-21 DIAGNOSIS — I428 Other cardiomyopathies: Secondary | ICD-10-CM

## 2018-03-21 DIAGNOSIS — I447 Left bundle-branch block, unspecified: Secondary | ICD-10-CM

## 2018-03-21 DIAGNOSIS — I421 Obstructive hypertrophic cardiomyopathy: Secondary | ICD-10-CM

## 2018-03-21 DIAGNOSIS — I517 Cardiomegaly: Secondary | ICD-10-CM

## 2018-03-21 NOTE — Telephone Encounter (Signed)
-----   Message from Chrystie Nose, MD sent at 03/20/2018  6:39 PM EDT ----- Regarding: cardiac MRI [name] has severe LVH on echo - this could be Hypertrophic cardiomyopathy. I would like to get an MRI for better measurement - can associate with NICM, LBBB, severe LV wall thickness, evalute for Hypertrophic cardiomyopathy.  Thanks.  Dr. Rexene Edison

## 2018-03-21 NOTE — Telephone Encounter (Signed)
Patient called with MD recommendations to have cardiac MRI. He agrees w/plan. He is aware this test will be pre-certed with insurance and he should receive a call from a scheduler to set up this test. He voiced understanding.

## 2018-03-27 ENCOUNTER — Encounter: Payer: Self-pay | Admitting: Internal Medicine

## 2018-04-04 ENCOUNTER — Ambulatory Visit (HOSPITAL_COMMUNITY)
Admission: RE | Admit: 2018-04-04 | Discharge: 2018-04-04 | Disposition: A | Discharge status: Home / Self Care | Payer: BLUE CROSS/BLUE SHIELD | Source: Ambulatory Visit | Attending: Internal Medicine | Admitting: Internal Medicine

## 2018-04-04 DIAGNOSIS — I340 Nonrheumatic mitral (valve) insufficiency: Secondary | ICD-10-CM | POA: Insufficient documentation

## 2018-04-04 DIAGNOSIS — I517 Cardiomegaly: Secondary | ICD-10-CM | POA: Insufficient documentation

## 2018-04-04 DIAGNOSIS — I428 Other cardiomyopathies: Secondary | ICD-10-CM

## 2018-04-04 DIAGNOSIS — I429 Cardiomyopathy, unspecified: Secondary | ICD-10-CM | POA: Insufficient documentation

## 2018-04-04 DIAGNOSIS — I447 Left bundle-branch block, unspecified: Secondary | ICD-10-CM | POA: Diagnosis not present

## 2018-04-04 LAB — CREATININE, SERUM
Creatinine, Ser: 1.07 mg/dL (ref 0.61–1.24)
GFR calc Af Amer: >60 mL/min (ref >60)
GFR calc non Af Amer: >60 mL/min (ref >60)

## 2018-04-04 MED ORDER — GADOBENATE DIMEGLUMINE 529 MG/ML IV SOLN
20.0000 mL | Freq: Once | INTRAVENOUS | Status: AC | PRN
  Administered 2018-04-04: 20 mL via INTRAVENOUS

## 2018-04-05 ENCOUNTER — Encounter: Payer: Self-pay | Admitting: Internal Medicine

## 2019-03-17 ENCOUNTER — Telehealth: Payer: Self-pay | Admitting: Internal Medicine

## 2019-03-17 NOTE — Telephone Encounter (Signed)
New message     Called to convert already scheduled office visit on 03-21-19 to a virtual video visit.  Pt gave consent for video visit.  YOUR CARDIOLOGY TEAM HAS ARRANGED FOR AN E-VISIT FOR YOUR APPOINTMENT - PLEASE REVIEW IMPORTANT INFORMATION BELOW SEVERAL DAYS PRIOR TO YOUR APPOINTMENT  Due to the recent COVID-19 pandemic, we are transitioning in-person office visits to tele-medicine visits in an effort to decrease unnecessary exposure to our patients, their families, and staff. These visits are billed to your insurance just like a normal visit is. We also encourage you to sign up for MyChart if you have not already done so. You will need a smartphone if possible. For patients that do not have this, we can still complete the visit using a regular telephone but do prefer a smartphone to enable video when possible. You may have a family member that lives with you that can help. If possible, we also ask that you have a blood pressure cuff and scale at home to measure your blood pressure, heart rate and weight prior to your scheduled appointment. Patients with clinical needs that need an in-person evaluation and testing will still be able to come to the office if absolutely necessary. If you have any questions, feel free to call our office.     YOUR PROVIDER WILL BE USING THE FOLLOWING PLATFORM TO COMPLETE YOUR VISIT: Doximity   IF USING MYCHART - How to Download the MyChart App to Your SmartPhone   - If Apple, go to [employer] and type in MyChart in the search bar and download the app. If Android, ask patient to go to [employer] and type in [facility] in the search bar and download the app. The app is free but as with any other app downloads, your phone may require you to verify saved payment information or Apple/Android password.  - You will need to then log into the app with your MyChart username and password, and select [facility] as your healthcare provider to link the account.  - When it  is time for your visit, go to the MyChart app, find appointments, and click Begin Video Visit. Be sure to Select Allow for your device to access the Microphone and Camera for your visit. You will then be connected, and your provider will be with you shortly.  **If you have any issues connecting or need assistance, please contact MyChart service desk [phone])**  **If using a computer, in order to ensure the best quality for your visit, you will need to use either of the following Internet Browsers: Agricultural consultant or Microsoft Edge**   IF USING DOXIMITY or [email or web address] - The staff will give you instructions on receiving your link to join the meeting the day of your visit.      2-3 DAYS BEFORE YOUR APPOINTMENT  You will receive a telephone call from one of our HeartCare team members - your caller ID may say "Unknown caller." If this is a video visit, we will walk you through how to get the video launched on your phone. We will remind you check your blood pressure, heart rate and weight prior to your scheduled appointment. If you have an Apple Watch or Kardia, please upload any pertinent ECG strips the day before or morning of your appointment to MyChart. Our staff will also make sure you have reviewed the consent and agree to move forward with your scheduled tele-health visit.     THE DAY OF YOUR APPOINTMENT  Approximately 15 minutes prior to your scheduled appointment, you will receive a telephone call from one of [facility] team - your caller ID may say "Unknown caller."  Our staff will confirm medications, vital signs for the day and any symptoms you may be experiencing. Please have this information available prior to the time of visit start. It may also be helpful for you to have a pad of paper and pen handy for any instructions given during your visit. They will also walk you through joining the smartphone meeting if this is a video visit.    CONSENT FOR TELE-HEALTH VISIT -  PLEASE REVIEW  I hereby voluntarily request, consent and authorize [facility] and its employed or contracted physicians, physician assistants, nurse practitioners or other licensed health care professionals (the Practitioner), to provide me with telemedicine health care services (the Services") as deemed necessary by the treating Practitioner. I acknowledge and consent to receive the Services by the Practitioner via telemedicine. I understand that the telemedicine visit will involve communicating with the Practitioner through live audiovisual communication technology and the disclosure of certain medical information by electronic transmission. I acknowledge that I have been given the opportunity to request an in-person assessment or other available alternative prior to the telemedicine visit and am voluntarily participating in the telemedicine visit.  I understand that I have the right to withhold or withdraw my consent to the use of telemedicine in the course of my care at any time, without affecting my right to future care or treatment, and that the Practitioner or I may terminate the telemedicine visit at any time. I understand that I have the right to inspect all information obtained and/or recorded in the course of the telemedicine visit and may receive copies of available information for a reasonable fee.  I understand that some of the potential risks of receiving the Services via telemedicine include:   Delay or interruption in medical evaluation due to technological equipment failure or disruption;  Information transmitted may not be sufficient (e.g. poor resolution of images) to allow for appropriate medical decision making by the Practitioner; and/or   In rare instances, security protocols could fail, causing a breach of personal health information.  Furthermore, I acknowledge that it is my responsibility to provide information about my medical history, conditions and care that is complete  and accurate to the best of my ability. I acknowledge that Practitioner's advice, recommendations, and/or decision may be based on factors not within their control, such as incomplete or inaccurate data provided by me or distortions of diagnostic images or specimens that may result from electronic transmissions. I understand that the practice of medicine is not an exact science and that Practitioner makes no warranties or guarantees regarding treatment outcomes. I acknowledge that I will receive a copy of this consent concurrently upon execution via email to the email address I last provided but may also request a printed copy by calling the office of [facility].    I understand that my insurance will be billed for this visit.   I have read or had this consent read to me.  I understand the contents of this consent, which adequately explains the benefits and risks of the Services being provided via telemedicine.   I have been provided ample opportunity to ask questions regarding this consent and the Services and have had my questions answered to my satisfaction.  I give my informed consent for the services to be provided through the use of telemedicine in my medical  care  By participating in this telemedicine visit I agree to the above.

## 2019-03-20 ENCOUNTER — Telehealth: Payer: Self-pay | Admitting: Internal Medicine

## 2019-03-20 NOTE — Telephone Encounter (Signed)
Smartphone/consent/ my chart/ pre reg completed °

## 2019-03-21 ENCOUNTER — Encounter: Payer: Self-pay | Admitting: Internal Medicine

## 2019-03-21 ENCOUNTER — Telehealth (INDEPENDENT_AMBULATORY_CARE_PROVIDER_SITE_OTHER): Payer: 59 | Admitting: Internal Medicine

## 2019-03-21 VITALS — Ht 72.0 in | Wt 207.0 lb

## 2019-03-21 DIAGNOSIS — I428 Other cardiomyopathies: Secondary | ICD-10-CM | POA: Diagnosis not present

## 2019-03-21 DIAGNOSIS — I447 Left bundle-branch block, unspecified: Secondary | ICD-10-CM | POA: Diagnosis not present

## 2019-03-21 DIAGNOSIS — I517 Cardiomegaly: Secondary | ICD-10-CM

## 2019-03-21 DIAGNOSIS — Z7189 Other specified counseling: Secondary | ICD-10-CM | POA: Diagnosis not present

## 2019-03-21 MED ORDER — LOSARTAN POTASSIUM 25 MG PO TABS: 25.0000 mg | ORAL_TABLET | Freq: Every day | ORAL | 3 refills | Status: DC

## 2019-03-21 NOTE — Patient Instructions (Signed)
Medication Instructions:  Your physician recommends that you continue on your current medications as directed. Please refer to the Current Medication list given to you today.  If you need a refill on your cardiac medications before your next appointment, please call your pharmacy.   Lab work: NONE If you have labs (blood work) drawn today and your tests are completely normal, you will receive your results only by: . MyChart Message (if you have MyChart) OR . A paper copy in the mail If you have any lab test that is abnormal or we need to change your treatment, we will call you to review the results.  Testing/Procedures: NONE  Follow-Up: At [facility], you and your health needs are our priority.  As part of our continuing mission to provide you with exceptional heart care, we have created designated Provider Care Teams.  These Care Teams include your primary Cardiologist (physician) and Advanced Practice Providers (APPs -  Physician Assistants and Nurse Practitioners) who all work together to provide you with the care you need, when you need it. You will need a follow up appointment in 12 months.  Please call our office 2 months in advance to schedule this appointment.  You may see Dr. Hilty or one of the following Advanced Practice Providers on your designated Care Team: Hao Meng, PA-C . Angela Duke, PA-C  Any Other Special Instructions Will Be Listed Below (If Applicable).    

## 2019-03-21 NOTE — Progress Notes (Signed)
Virtual Visit via Video Note   This visit type was conducted due to national recommendations for restrictions regarding the COVID-19 Pandemic (e.g. social distancing) in an effort to limit this patient's exposure and mitigate transmission in our community.  Due to his co-morbid illnesses, this patient is at least at moderate risk for complications without adequate follow up.  This format is felt to be most appropriate for this patient at this time.  All issues noted in this document were discussed and addressed.  A limited physical exam was performed with this format.  Please refer to the patient's chart for his consent to telehealth for [facility].   Evaluation Performed:  Doximity video visit  Date:  03/21/2019   ID:  Harold Jacobs, DOB 1971-04-17, MRN [id number]  Patient Location:  [address] [place] [address]  Provider location:   [address] [place], [place] [address]  PCP:  Betsey Holiday, MD  Cardiologist:  No primary care provider on file. Electrophysiologist:  None   Chief Complaint:  No complaints  History of Present Illness:    Harold Jacobs is a 48 y.o. male who presents via audio/video conferencing for a telehealth visit today.  Harold Jacobs was seen today for video follow-up.  This is an annual visit.  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  In addition he has a murmur of mild mitral regurgitation.  Initially his LVEF was around 40% however has improved on low-dose losartan up to 55 to 60% by echo.  His last echo did show what was thought to be severe hypertrophy however other findings contradicted that.  I did order a cardiac MRI which showed essentially normal wall thickness, mild LV dilatation and mild proximal septal thickening.  This could explain why he has a left bundle branch block with changes in the septal area.  Is not clear whether this represents a hypertrophic cardiomyopathy  or other process of the proximal septum.  Despite that, he maintains NYHA class I symptoms with normal or near normal LV function.  The patient does not have symptoms concerning for COVID-19 infection (fever, chills, cough, or new SHORTNESS OF BREATH).    Prior CV studies:   The following studies were reviewed today:  Echo Cardiac MRI  PMHx:  Past Medical History:  Diagnosis Date   Family history of anesthesia complication    "Mom, PONV" (05/07/2013)   Heart murmur    LBBB (left bundle branch block) 05/25/2016   Migraine    "none in many years" (05/25/2016)   Numbness    Hx: of left arm/hand "preop from my neck"   PONV (postoperative nausea and vomiting)     Past Surgical History:  Procedure Laterality Date   ANTERIOR CERVICAL DECOMP/DISCECTOMY FUSION N/A 05/07/2013   Procedure: ANTERIOR CERVICAL DISCECTOMY FUSION   (ACDF C5-7 ) (2 LEVELS) ;  Surgeon: Venita Lick, MD;  Location: [facility] OR;  Service: Orthopedics;  Laterality: N/A;  cervical five-six, six-seven anterior cervical discectomy and fusion   BACK SURGERY     CYST REMOVAL NECK Right 1990   FRACTURE SURGERY     NASAL FRACTURE SURGERY  1984   ORIF PROXIMAL TIBIAL PLATEAU FRACTURE Left 05/25/2016   ORIF TIBIA PLATEAU Left 05/25/2016   Procedure: OPEN REDUCTION INTERNAL FIXATION (ORIF)  LEFT TIBIAL PLATEAU;  Surgeon: Francena Hanly, MD;  Location: [facility] OR;  Service: Orthopedics;  Laterality: Left;   REFRACTIVE SURGERY  ~  2004   TONSILLECTOMY  1976   WRIST FRACTURE SURGERY Left 1997    FAMHx:  Family History  Problem Relation Age of Onset   Hypertension Mother    Hypertension Father    Cancer - Prostate Father     SOCHx:   reports that he has never smoked. He has never used smokeless tobacco. He reports current alcohol use of about 9.0 standard drinks of alcohol per week. He reports that he does not use drugs.  ALLERGIES:  No Known Allergies  MEDS:  Current Meds  Medication Sig   losartan (COZAAR)  25 MG tablet Take 1 tablet (25 mg total) by mouth daily.     ROS: Pertinent items noted in HPI and remainder of comprehensive ROS otherwise negative.  Labs/Other Tests and Data Reviewed:    Recent Labs: 04/04/2018: Creatinine, Ser 1.07   Recent Lipid Panel No results found for: CHOL, TRIG, HDL, CHOLHDL, LDLCALC, LDLDIRECT  Wt Readings from Last 3 Encounters:  03/21/19 207 lb (93.9 kg)  03/19/18 216 lb 12.8 oz (98.3 kg)  01/15/17 215 lb (97.5 kg)     Exam:    Vital Signs:  Ht 6' (1.829 m)    Wt 207 lb (93.9 kg)    BMI 28.07 kg/m    General appearance: alert and no distress Lungs: No audible wheezes or visual respiratory difficulty Extremities: extremities normal, atraumatic, no cyanosis or edema Skin: Normal skin color Neurologic: Mental status: Alert, oriented, thought content appropriate Psych: Pleasant  ASSESSMENT & PLAN:    1. Nonischemic cardiomyopathy-EF improved from 40 to 45% up to 55% (51% by cardiac MRI 2019) 2. Proximal septal thickening 3. LBBB  Harold Jacobs has a nonischemic cardiomyopathy with improvement of LVEF in 2019.  There is proximal septal thickening, but no evidence of LVOT obstruction.  Is not clear whether this represents a hypertrophic cardiomyopathy or not.  There is no family history of sudden cardiac death, early onset heart failure or other features suggestive of hypertrophic cardiomyopathy.  He has had a positive response to losartan suggesting that the left bundle branch itself may not be the entire cause of his cardiomyopathy since it is still persistent.  I recommend therefore we remain on losartan long-term.  We will consider repeating an echocardiogram at some point in the future but as he remains asymptomatic, I recommended continued exercise and healthy diet.  His weight is down about 9 pounds since we saw him last year.  COVID-19 Education: The signs and symptoms of COVID-19 were discussed with the patient and how to seek care for testing  (follow up with PCP or arrange E-visit).  The importance of social distancing was discussed today.  Patient Risk:   After full review of this patients clinical status, I feel that they are at least moderate risk at this time.  Time:   Today, I have spent 25 minutes with the patient with telehealth technology discussing cardiomyopathy, LBBB.     Medication Adjustments/Labs and Tests Ordered: Current medicines are reviewed at length with the patient today.  Concerns regarding medicines are outlined above.   Tests Ordered: No orders of the defined types were placed in this encounter.   Medication Changes: No orders of the defined types were placed in this encounter.   Disposition:  in 1 year(s)  Chrystie NoseKenneth C. Emara Lichter, MD, [facility] CenterFACC, FACP  [place]   [facility]  Medical Director of the Advanced Lipid Disorders &  Cardiovascular Risk Reduction Clinic Diplomate of the [employer] Board of  Clinical Lipidology Attending Cardiologist  Direct Dial: [phone]   Fax: [phone]  Website:  [email or web address]  Chrystie Nose, MD  03/21/2019 8:28 AM

## 2019-04-06 ENCOUNTER — Other Ambulatory Visit: Payer: Self-pay | Admitting: Internal Medicine

## 2019-04-06 DIAGNOSIS — I428 Other cardiomyopathies: Secondary | ICD-10-CM

## 2020-01-16 IMAGING — MR MR CARD MORPHOLOGY WO/W CM
25 of 27 series · 38 of 40 positions shown · IV contrast (Contrast agent)
Comparison: none

CLINICAL DATA: 46-year-old male with h/o LBBB,nonischemic
cardiomyopathy EF 45-50%, that improved to 55-60% and severe LVH on
echocardiogram in January 2017.

EXAM:
CARDIAC MRI
TECHNIQUE: The patient was scanned on a 1.5 Tesla GE magnet. A dedicated
cardiac coil was used. Functional imaging was done using Fiesta
sequences. [DATE], and 4 chamber views were done to assess for RWMA's.
Modified Tagp rule using a short axis stack was used to
calculate an ejection fraction on a dedicated work station using
Circle software. The patient received 28 cc of Multihance. After 10
minutes inversion recovery sequences were used to assess for
infiltration and scar tissue.
CONTRAST:  28 cc  of Multihance

[Series 7: bSSFP · oblique · 8.0mm · 1.61mm/px · 1 of 25 slices shown (1 of 20)]
[im 1/25]
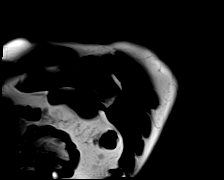

[Series 7: bSSFP · oblique · 8.0mm · 1.61mm/px · 1 of 25 slices shown (2 of 20)]
[im 1/25]
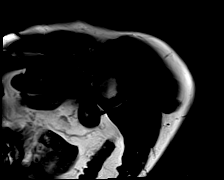

[Series 7: bSSFP · oblique · 8.0mm · 1.61mm/px · 2 of 25 slices shown (3 of 20)]
[im 1/25]
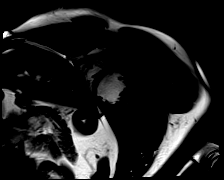
[im 25/25]
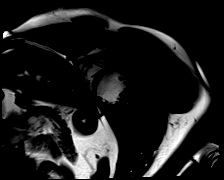

[Series 7: bSSFP · oblique · 8.0mm · 1.61mm/px · 2 of 25 slices shown (4 of 20)]
[im 1/25]
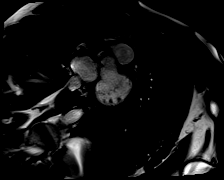
[im 25/25]
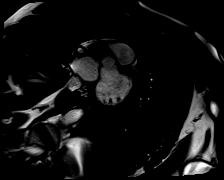

[Series 7: bSSFP · oblique · 8.0mm · 1.61mm/px · 2 of 25 slices shown (5 of 20)]
[im 1/25]
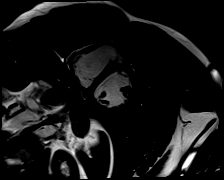
[im 25/25]
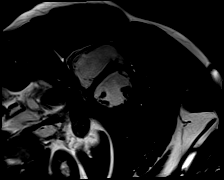

[Series 7: bSSFP · oblique · 8.0mm · 1.61mm/px · 2 of 25 slices shown (6 of 20)]
[im 1/25]
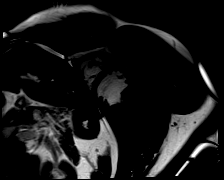
[im 25/25]
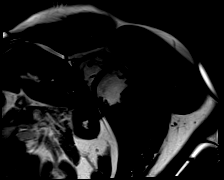

[Series 7: bSSFP · oblique · 8.0mm · 1.61mm/px · 1 of 25 slices shown (7 of 20)]
[im 1/25]
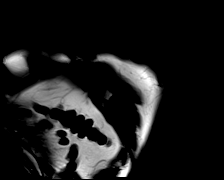

[Series 7: bSSFP · oblique · 8.0mm · 1.61mm/px · 2 of 25 slices shown (8 of 20)]
[im 1/25]
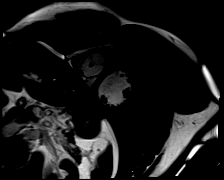
[im 25/25]
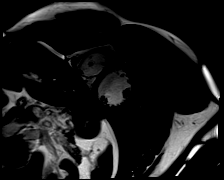

[Series 7: bSSFP · oblique · 8.0mm · 1.61mm/px · 2 of 25 slices shown (9 of 20)]
[im 1/25]
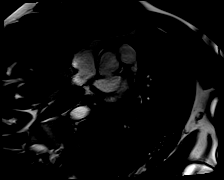
[im 25/25]
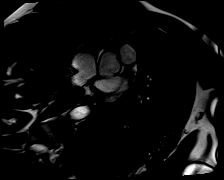

[Series 7: bSSFP · oblique · 8.0mm · 1.61mm/px · 2 of 25 slices shown (10 of 20)]
[im 1/25]
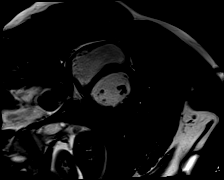
[im 25/25]
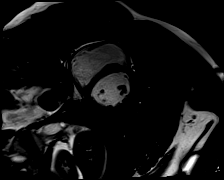

[Series 7: bSSFP · oblique · 8.0mm · 1.61mm/px · 1 of 25 slices shown (11 of 20)]
[im 1/25]
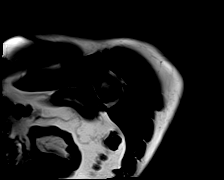

[Series 7: bSSFP · oblique · 8.0mm · 1.61mm/px · 1 of 25 slices shown (12 of 20)]
[im 1/25]
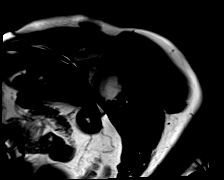

[Series 7: bSSFP · oblique · 8.0mm · 1.61mm/px · 2 of 25 slices shown (13 of 20)]
[im 1/25]
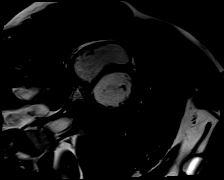
[im 25/25]
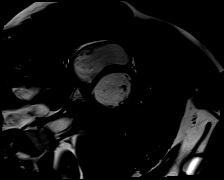

[Series 7: bSSFP · oblique · 8.0mm · 1.61mm/px · 2 of 25 slices shown (14 of 20)]
[im 1/25]
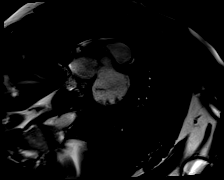
[im 25/25]
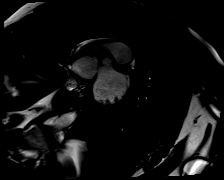

[Series 7: bSSFP · oblique · 8.0mm · 1.61mm/px · 1 of 25 slices shown (15 of 20)]
[im 1/25]
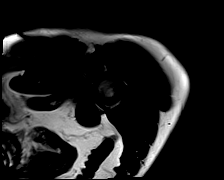

[Series 7: bSSFP · oblique · 8.0mm · 1.61mm/px · 1 of 25 slices shown (16 of 20)]
[im 1/25]
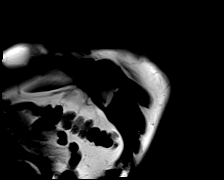

[Series 7: bSSFP · oblique · 8.0mm · 1.61mm/px · 2 of 25 slices shown (17 of 20)]
[im 1/25]
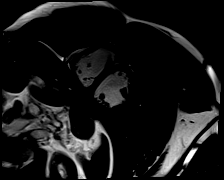
[im 25/25]
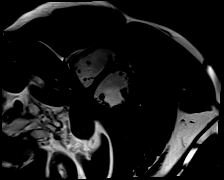

[Series 8: t2_stir_db_sax · oblique · 8.0mm · 1.73mm/px · 1 of 17 slices shown]
[im 1/17]
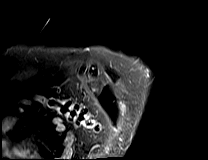

[Series 13: bSSFP · axial · 6.0mm · 1.41mm/px · z∈[-205,-205]mm · 2 of 25 slices shown (18 of 20)]
[im 1/25]
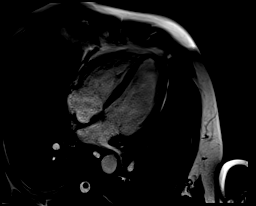
[im 25/25]
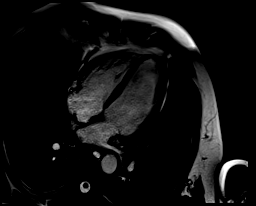

[Series 14: bSSFP · oblique · 6.0mm · 1.41mm/px · 2 of 25 slices shown (19 of 20)]
[im 1/25]
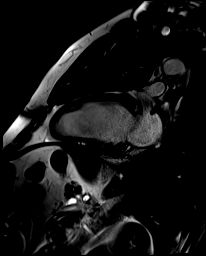
[im 25/25]
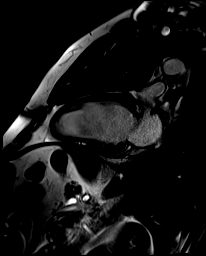

[Series 15: bSSFP · oblique · 6.0mm · 1.41mm/px · 2 of 25 slices shown (20 of 20)]
[im 1/25]
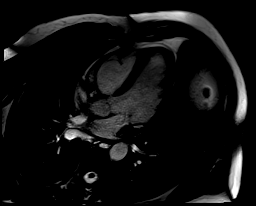
[im 25/25]
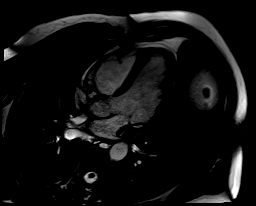

[Series 19: lge short axis_mag · oblique · 8.0mm · 1.50mm/px · 1 of 17 slices shown]
[im 1/17]
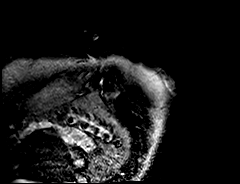

[Series 20: lge short axis_psir · oblique · 8.0mm · 1.50mm/px · 1 of 17 slices shown]
[im 1/17]
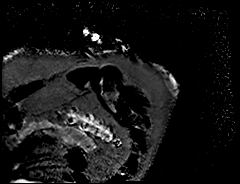

[Series 23: lge radial ((date)ch)_mag · axial · 6.0mm · 1.32mm/px · 1 of 1 slices shown]
[im 1/1]
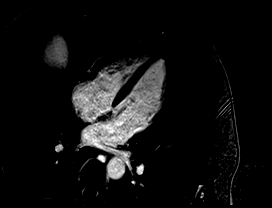

[Series 24: lge radial ((date)ch)_psir · axial · 6.0mm · 1.32mm/px · 1 of 1 slices shown]
[im 1/1]
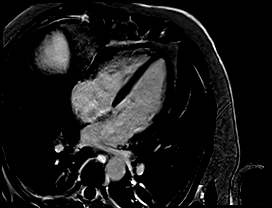

[38 of 40 positions shown; findings below may reference images not displayed]

FINDINGS: 1. Mildly dilated left ventricle with mild basal septal hypertrophy
(12 mm) and low normal systolic function (LVEF = 51%). There is
paradoxical septal motion. There is no late gadolinium enhancement
in the left ventricular myocardium.

LVEDD: 61 mm

LVESD: 44 mm

LVEDV: 220 ml

LVESV: 108 ml

SV: 112 ml

CO: 7.0 L/min

Myocardial mass: 157 g

2. Normal right ventricular size, thickness and systolic function
(LVEF = 57%). There are no regional wall motion abnormalities.

3.  Normal left and right atrial size.

4. Normal size of the aortic root, ascending aorta and pulmonary
artery.

5.  Mild mitral regurgitation.

6.  Normal pericardium.  No pericardial effusion.
IMPRESSION: 1. Mildly dilated left ventricle with mild basal septal hypertrophy
(12 mm) and low normal systolic function (LVEF = 51%). There is
paradoxical septal motion. There is no late gadolinium enhancement
in the left ventricular myocardium.

2. Normal right ventricular size, thickness and systolic function
(LVEF = 57%). There are no regional wall motion abnormalities.

3. Normal left and right atrial size.

4. Mild mitral regurgitation.

Collectively, findings are consistent with non-ischemic
cardiomyopathy with no evidence for infiltrative, inflammatory or
hypertrophic cardiomyopathy.

## 2020-04-01 ENCOUNTER — Ambulatory Visit: Payer: 59 | Admitting: Internal Medicine

## 2020-04-01 ENCOUNTER — Other Ambulatory Visit: Payer: Self-pay

## 2020-04-01 ENCOUNTER — Encounter: Payer: Self-pay | Admitting: Internal Medicine

## 2020-04-01 VITALS — BP 125/90 | HR 87 | Temp 98.1°F | Ht 72.0 in | Wt 212.0 lb

## 2020-04-01 DIAGNOSIS — I447 Left bundle-branch block, unspecified: Secondary | ICD-10-CM | POA: Diagnosis not present

## 2020-04-01 DIAGNOSIS — I428 Other cardiomyopathies: Secondary | ICD-10-CM

## 2020-04-01 DIAGNOSIS — I517 Cardiomegaly: Secondary | ICD-10-CM

## 2020-04-01 MED ORDER — LOSARTAN POTASSIUM 25 MG PO TABS: 25.0000 mg | ORAL_TABLET | Freq: Every day | ORAL | 3 refills | Status: DC

## 2020-04-01 NOTE — Patient Instructions (Signed)

## 2020-04-03 ENCOUNTER — Encounter: Payer: Self-pay | Admitting: Internal Medicine

## 2020-04-03 NOTE — Progress Notes (Signed)
OFFICE NOTE  Chief Complaint:  Routine follow-up  Primary Care Physician: Amado Nash, MD  HPI:  Harold Jacobs is a 49 y.o. male who was recently seen during a hospitalization for elective left knee surgery. Unfortunately he suffered a fracture while wake boarding. Prior to surgery he had an EKG which demonstrated a left bundle branch block and we were urgently consult it for preoperative clearance. He reports being totally asymptomatic he denied any chest pain or worsening shortness of breath. He exercises "very heavily", in fact, since surgery he continues to do pushups and upper body strength exercises although he cannot use his left knee to great extents. Prior to surgery he had an echocardiogram which showed a reduced EF of 45-50% and global hypokinesis. Due to his relative lack of symptoms, I recommended going ahead with surgery. He underwent surgery without any complications. Subsequently he had postoperative outpatient nuclear stress testing which indicated an EF of 38% however no ischemia was noted. I believe EF is slightly lower than the echo based on the fact that there is likely a gating abnormality due to left bundle branch block. We had a long discussion about the etiology of his left bundle branch block and a possible etiologies of his cardiomyopathy. It could be that the left bundle branch block ounces heart failure or that his cardiomyopathy is associated with left bundle branch block. QRS duration, by the way is less than 150 ms and his LVEF is greater than 35%. As he has class I symptoms, based on current guidelines, CRT therapy is not indicated.  01/15/2017  Harold Jacobs returns today for follow-up. He continues to struggle to try to recover from his left tibial plateau fracture. He is working with sports medicine doctors due to poor range of motion in his left knee. From a heart standpoint, he is EF prior to surgery showed 45-50% and subsequently nuclear testing showed an  EF of 38%. He has been started on low-dose losartan 25 mg and blood pressure will not allow increases in this. He seems to tolerate it and continues to have NYHA class I symptoms. EF now is 45-50% again which is either unchanged or perhaps improved.   03/20/2018  Harold Jacobs was seen today in follow-up.  Is been a year since I last saw him.  Overall he reports doing well.  He denies any chest pain or worsening shortness of breath.  He just underwent a repeat echocardiogram which I am pleased to report demonstrates improvement in LVEF up to 55 to 60%, although there is stage I diastolic dysfunction.  It was noted that he has severe LV wall thickening.  Although I personally reviewed the echo, I re-reviewed the echo images personally today, and the LV does appear to be thickened, measuring up to 1.7 cm.  He has normal left atrial size and this is certainly concerning for possible hypertrophic cardiomyopathy.  04/01/2020  Harold Jacobs returns for follow-up.  He was last seen a year ago via virtual visit.  Overall he is doing well.  Diastolic blood pressure is a little elevated today however he remains on low-dose losartan.  Unfortunately, he was diagnosed with prostate cancer.  He had the recommended PSA screening at age 36 which he just turned in August 2020 and subsequently work-up revealed prostate cancer.  He underwent chemotherapy for this and has tolerated it fairly well.  He is currently on leuprolide.  No signs or symptoms of heart failure.  PMHx:  Past Medical History:  Diagnosis Date  . Family history of anesthesia complication    "Mom, PONV" (05/07/2013)  . Heart murmur   . LBBB (left bundle branch block) 05/25/2016  . Migraine    "none in many years" (05/25/2016)  . Numbness    Hx: of left arm/hand "preop from my neck"  . PONV (postoperative nausea and vomiting)     Past Surgical History:  Procedure Laterality Date  . ANTERIOR CERVICAL DECOMP/DISCECTOMY FUSION N/A 05/07/2013    Procedure: ANTERIOR CERVICAL DISCECTOMY FUSION   (ACDF C5-7 ) (2 LEVELS) ;  Surgeon: Venita Lick, MD;  Location: [facility] OR;  Service: Orthopedics;  Laterality: N/A;  cervical five-six, six-seven anterior cervical discectomy and fusion  . BACK SURGERY    . CYST REMOVAL NECK Right 1990  . FRACTURE SURGERY    . NASAL FRACTURE SURGERY  1984  . ORIF PROXIMAL TIBIAL PLATEAU FRACTURE Left 05/25/2016  . ORIF TIBIA PLATEAU Left 05/25/2016   Procedure: OPEN REDUCTION INTERNAL FIXATION (ORIF)  LEFT TIBIAL PLATEAU;  Surgeon: Francena Hanly, MD;  Location: [facility] OR;  Service: Orthopedics;  Laterality: Left;  . REFRACTIVE SURGERY  ~ 2004  . TONSILLECTOMY  1976  . WRIST FRACTURE SURGERY Left 1997    FAMHx:  Family History  Problem Relation Age of Onset  . Hypertension Mother   . Hypertension Father   . Cancer - Prostate Father     SOCHx:   reports that he has never smoked. He has never used smokeless tobacco. He reports current alcohol use of about 9.0 standard drinks of alcohol per week. He reports that he does not use drugs.  ALLERGIES:  No Known Allergies  ROS: Pertinent items noted in HPI and remainder of comprehensive ROS otherwise negative.  HOME MEDS: Current Outpatient Medications  Medication Sig Dispense Refill  . Leuprolide Acetate (LUPRON [place]) Inject into the skin daily.    Marland Kitchen losartan (COZAAR) 25 MG tablet Take 1 tablet (25 mg total) by mouth daily. 90 tablet 3   No current facility-administered medications for this visit.    LABS/IMAGING: No results found for this or any previous visit (from the past 48 hour(s)). No results found.  WEIGHTS: Wt Readings from Last 3 Encounters:  04/01/20 212 lb (96.2 kg)  03/21/19 207 lb (93.9 kg)  03/19/18 216 lb 12.8 oz (98.3 kg)    VITALS: BP 125/90   Pulse 87   Temp 98.1 F (36.7 C)   Ht 6' (1.829 m)   Wt 212 lb (96.2 kg)   SpO2 96%   BMI 28.75 kg/m   EXAM: General appearance: alert and no distress Neck: no carotid bruit and no  JVD Lungs: clear to auscultation bilaterally Heart: regular rate and rhythm, S1, S2 normal, no murmur, click, rub or gallop Abdomen: soft, non-tender; bowel sounds normal; no masses,  no organomegaly Extremities: extremities normal, atraumatic, no cyanosis or edema Pulses: 2+ and symmetric Skin: Skin color, texture, turgor normal. No rashes or lesions Neurologic: Grossly normal Psych: Pleasant  EKG: Normal sinus rhythm at 60, LBBB (QRSD-172 ms)-personally reviewed  ASSESSMENT: 1. LBBB 2. 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 ?severe LVH-measures 1.7 cm 4. Prostate cancer  PLAN: 1.   Harold Jacobs surprisingly was found to have prostate cancer via screening PSA last summer, the first time he was eligible for this test.  He was ultimately treated with chemotherapy and is currently on antiandrogen therapy.  He seems to be doing well.  He has  no heart failure symptoms.  EKG shows a stable left bundle branch block.  No changes to his medicines today.  Follow-up with me annually or sooner as necessary.  Chrystie Nose, MD, [facility], FACP  [place]  [facility]  Medical Director of the Advanced Lipid Disorders &  Cardiovascular Risk Reduction Clinic Diplomate of the American Board of Clinical Lipidology Attending Cardiologist  Direct Dial: [phone]  Fax: [phone]  Website:  [email or web address]  Harold Jacobs 04/03/2020, 8:22 PM

## 2021-03-15 ENCOUNTER — Other Ambulatory Visit: Payer: Self-pay | Admitting: Internal Medicine

## 2021-03-15 DIAGNOSIS — I428 Other cardiomyopathies: Secondary | ICD-10-CM

## 2021-07-29 ENCOUNTER — Other Ambulatory Visit: Payer: Self-pay

## 2021-07-29 ENCOUNTER — Ambulatory Visit: Payer: 59 | Admitting: Internal Medicine

## 2021-07-29 VITALS — BP 124/78 | HR 80 | Ht 72.0 in | Wt 197.4 lb

## 2021-07-29 DIAGNOSIS — I447 Left bundle-branch block, unspecified: Secondary | ICD-10-CM

## 2021-07-29 DIAGNOSIS — I428 Other cardiomyopathies: Secondary | ICD-10-CM | POA: Diagnosis not present

## 2021-07-29 DIAGNOSIS — I517 Cardiomegaly: Secondary | ICD-10-CM

## 2021-07-29 NOTE — Progress Notes (Signed)
OFFICE NOTE  Chief Complaint:  Routine follow-up  Primary Care Physician: Betsey Holiday, MD  HPI:  Harold Jacobs is a 50 y.o. male who was recently seen during a hospitalization for elective left knee surgery. Unfortunately he suffered a fracture while wake boarding. Prior to surgery he had an EKG which demonstrated a left bundle branch block and we were urgently consult it for preoperative clearance. He reports being totally asymptomatic he denied any chest pain or worsening shortness of breath. He exercises "very heavily", in fact, since surgery he continues to do pushups and upper body strength exercises although he cannot use his left knee to great extents. Prior to surgery he had an echocardiogram which showed a reduced EF of 45-50% and global hypokinesis. Due to his relative lack of symptoms, I recommended going ahead with surgery. He underwent surgery without any complications. Subsequently he had postoperative outpatient nuclear stress testing which indicated an EF of 38% however no ischemia was noted. I believe EF is slightly lower than the echo based on the fact that there is likely a gating abnormality due to left bundle branch block. We had a long discussion about the etiology of his left bundle branch block and a possible etiologies of his cardiomyopathy. It could be that the left bundle branch block ounces heart failure or that his cardiomyopathy is associated with left bundle branch block. QRS duration, by the way is less than 150 ms and his LVEF is greater than 35%. As he has class I symptoms, based on current guidelines, CRT therapy is not indicated.  01/15/2017  [name] returns today for follow-up. He continues to struggle to try to recover from his left tibial plateau fracture. He is working with sports medicine doctors due to poor range of motion in his left knee. From a heart standpoint, he is EF prior to surgery showed 45-50% and subsequently nuclear testing showed an  EF of 38%. He has been started on low-dose losartan 25 mg and blood pressure will not allow increases in this. He seems to tolerate it and continues to have NYHA class I symptoms. EF now is 45-50% again which is either unchanged or perhaps improved.   03/20/2018  [name] was seen today in follow-up.  Is been a year since I last saw him.  Overall he reports doing well.  He denies any chest pain or worsening shortness of breath.  He just underwent a repeat echocardiogram which I am pleased to report demonstrates improvement in LVEF up to 55 to 60%, although there is stage I diastolic dysfunction.  It was noted that he has severe LV wall thickening.  Although I personally reviewed the echo, I re-reviewed the echo images personally today, and the LV does appear to be thickened, measuring up to 1.7 cm.  He has normal left atrial size and this is certainly concerning for possible hypertrophic cardiomyopathy.  04/01/2020  [name] returns for follow-up.  He was last seen a year ago via virtual visit.  Overall he is doing well.  Diastolic blood pressure is a little elevated today however he remains on low-dose losartan.  Unfortunately, he was diagnosed with prostate cancer.  He had the recommended PSA screening at age 20 which he just turned in August 2020 and subsequently work-up revealed prostate cancer.  He underwent chemotherapy for this and has tolerated it fairly well.  He is currently on leuprolide.  No signs or symptoms of heart failure.  07/29/2021  [name] is  seen today for follow-up.  Overall he continues to do well.  He was receiving care at [facility] for prostate cancer.  He had a great experience there and after undergoing a number of therapies including novel immunotherapy he is free of prostate cancer with an undetectable PSA.  Interestingly, he was noted to have improvement in his EKG based on test that were done there.  He had 3 separate EKGs which showed no evidence of a  persistent left bundle branch block.  EKG today also shows some septal delay but a good transition in V3 and V4 not suggestive of left bundle branch block.  Why this is happened is unclear.  His cardiomyopathy had already improved prior to the EKG changes.  I have recommended remaining on at least low-dose ARB which seems to be maintaining a normal blood pressure and might provide some benefit from recurrent heart failure.  He also has made significant dietary changes, primarily vegetarian and exercises regularly saying he feels the best that he has an long time.  PMHx:  Past Medical History:  Diagnosis Date   Family history of anesthesia complication    "Mom, PONV" (05/07/2013)   Heart murmur    LBBB (left bundle branch block) 05/25/2016   Migraine    "none in many years" (05/25/2016)   Numbness    Hx: of left arm/hand "preop from my neck"   PONV (postoperative nausea and vomiting)     Past Surgical History:  Procedure Laterality Date   ANTERIOR CERVICAL DECOMP/DISCECTOMY FUSION N/A 05/07/2013   Procedure: ANTERIOR CERVICAL DISCECTOMY FUSION   (ACDF C5-7 ) (2 LEVELS) ;  Surgeon: Venita Lick, MD;  Location: [facility] OR;  Service: Orthopedics;  Laterality: N/A;  cervical five-six, six-seven anterior cervical discectomy and fusion   BACK SURGERY     CYST REMOVAL NECK Right 1990   FRACTURE SURGERY     NASAL FRACTURE SURGERY  1984   ORIF PROXIMAL TIBIAL PLATEAU FRACTURE Left 05/25/2016   ORIF TIBIA PLATEAU Left 05/25/2016   Procedure: OPEN REDUCTION INTERNAL FIXATION (ORIF)  LEFT TIBIAL PLATEAU;  Surgeon: Francena Hanly, MD;  Location: [facility] OR;  Service: Orthopedics;  Laterality: Left;   REFRACTIVE SURGERY  ~ 2004   TONSILLECTOMY  1976   WRIST FRACTURE SURGERY Left 1997    FAMHx:  Family History  Problem Relation Age of Onset   Hypertension Mother    Hypertension Father    Cancer - Prostate Father     SOCHx:   reports that he has never smoked. He has never used smokeless tobacco. He reports  current alcohol use of about 9.0 standard drinks per week. He reports that he does not use drugs.  ALLERGIES:  No Known Allergies  ROS: Pertinent items noted in HPI and remainder of comprehensive ROS otherwise negative.  HOME MEDS: Current Outpatient Medications  Medication Sig Dispense Refill   enzalutamide (XTANDI) 80 MG tablet      Leuprolide Acetate (LUPRON [place]) Inject into the skin daily.     losartan (COZAAR) 25 MG tablet TAKE 1 TABLET BY MOUTH EVERY DAY 90 tablet 3   No current facility-administered medications for this visit.    LABS/IMAGING: No results found for this or any previous visit (from the past 48 hour(s)). No results found.  WEIGHTS: Wt Readings from Last 3 Encounters:  07/29/21 197 lb 6.4 oz (89.5 kg)  04/01/20 212 lb (96.2 kg)  03/21/19 207 lb (93.9 kg)    VITALS: BP 124/78   Pulse 80  Ht 6' (1.829 m)   Wt 197 lb 6.4 oz (89.5 kg)   SpO2 98%   BMI 26.77 kg/m   EXAM: General appearance: alert and no distress Neck: no carotid bruit and no JVD Lungs: clear to auscultation bilaterally Heart: regular rate and rhythm, S1, S2 normal, no murmur, click, rub or gallop Abdomen: soft, non-tender; bowel sounds normal; no masses,  no organomegaly Extremities: extremities normal, atraumatic, no cyanosis or edema Pulses: 2+ and symmetric Skin: Skin color, texture, turgor normal. 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   [name] aims to be doing amazingly well.  He is now apparently in remission from prostate cancer with 0 PSA.  He is getting care at [facility].  He is left bundle branch block amazingly is resolved.  His nonischemic cardiomyopathy has improved.  He did have some  thickness on his LV wall which might have been why he had some bundle branch block but that seems to have improved.  We have not done a repeat echo recently but he has been asymptomatic.  I recommend we continue on low-dose losartan even though there is probably little evidence of benefit but his blood pressure is well controlled, he is tolerating it and there may be some heart failure benefit long-term.  Plan follow-up with me in 2 years or sooner as necessary.  Chrystie Nose, MD, [facility], FACP  [place]  [facility]  Medical Director of the Advanced Lipid Disorders &  Cardiovascular Risk Reduction Clinic Diplomate of the American Board of Clinical Lipidology Attending Cardiologist  Direct Dial: [phone]  Fax: [phone]  Website:  [email or web address] 07/29/2021, 10:22 AM

## 2021-07-29 NOTE — Patient Instructions (Signed)
Medication Instructions:  Your physician recommends that you continue on your current medications as directed. Please refer to the Current Medication list given to you today.  *If you need a refill on your cardiac medications before your next appointment, please call your pharmacy*   Follow-Up: At [facility], you and your health needs are our priority.  As part of our continuing mission to provide you with exceptional heart care, we have created designated Provider Care Teams.  These Care Teams include your primary Cardiologist (physician) and Advanced Practice Providers (APPs -  Physician Assistants and Nurse Practitioners) who all work together to provide you with the care you need, when you need it.  We recommend signing up for the patient portal called "MyChart".  Sign up information is provided on this After Visit Summary.  MyChart is used to connect with patients for Virtual Visits (Telemedicine).  Patients are able to view lab/test results, encounter notes, upcoming appointments, etc.  Non-urgent messages can be sent to your provider as well.   To learn more about what you can do with MyChart, go to [email or web address].    Your next appointment:   2 year(s)  The format for your next appointment:   In Person  Provider:   You may see Kenneth C Hilty, MD or one of the following Advanced Practice Providers on your designated Care Team:    Hao Meng, PA-C  Angela Duke, PA-C or   Krista Kroeger, PA-C    Other Instructions   

## 2022-03-10 ENCOUNTER — Other Ambulatory Visit: Payer: Self-pay | Admitting: Internal Medicine

## 2022-03-10 DIAGNOSIS — I428 Other cardiomyopathies: Secondary | ICD-10-CM

## 2023-02-28 ENCOUNTER — Other Ambulatory Visit: Payer: Self-pay | Admitting: Internal Medicine

## 2023-02-28 DIAGNOSIS — I428 Other cardiomyopathies: Secondary | ICD-10-CM

## 2024-03-16 ENCOUNTER — Other Ambulatory Visit: Payer: Self-pay | Admitting: Internal Medicine

## 2024-03-16 DIAGNOSIS — I428 Other cardiomyopathies: Secondary | ICD-10-CM

## 2024-04-17 ENCOUNTER — Other Ambulatory Visit: Payer: Self-pay | Admitting: Internal Medicine

## 2024-04-17 DIAGNOSIS — I428 Other cardiomyopathies: Secondary | ICD-10-CM

## 2024-05-01 ENCOUNTER — Other Ambulatory Visit: Payer: Self-pay | Admitting: Internal Medicine

## 2024-05-01 DIAGNOSIS — I428 Other cardiomyopathies: Secondary | ICD-10-CM

## 2024-06-28 ENCOUNTER — Other Ambulatory Visit: Payer: Self-pay | Admitting: Internal Medicine

## 2024-06-28 DIAGNOSIS — I428 Other cardiomyopathies: Secondary | ICD-10-CM

## 2024-06-30 ENCOUNTER — Encounter: Payer: Self-pay | Admitting: Internal Medicine

## 2024-07-01 ENCOUNTER — Other Ambulatory Visit: Payer: Self-pay

## 2024-07-01 DIAGNOSIS — I428 Other cardiomyopathies: Secondary | ICD-10-CM

## 2024-07-01 NOTE — Telephone Encounter (Signed)
 Pt of Dr. Mona. Past his 3rd attempt. Last OV was in 2022. 80yr F/U. Please advise on a refill

## 2024-07-01 NOTE — Telephone Encounter (Signed)
 He needs to have a scheduled follow-up appt with me or APP to review his updated condition and meds prior to authorizing further refills

## 2024-07-01 NOTE — Progress Notes (Unsigned)
 Cardiology Office Note   Date:  07/02/2024  ID:  Harold Jacobs, DOB 1971/11/13, MRN [id number] PCP: Nicholaus Norleen FALCON, MD  [facility] Providers Cardiologist:  Vinie JAYSON Maxcy, MD    History of Present Illness Harold Jacobs is a 53 y.o. male with a past medical history of   Nonischemic cardiomyopathy  LBBB  LVH Echocardiogram in 05/2016 showed EF45-50%, no regional wall motion abnormalities, septal motion with moderate dyssynergy consistent with LBBB  Stress test in 06/2016 showed a large size, moderate severity fixed inferseptal perfusion defect, possible scar or LBBB-related artifact. No ischemia  Later echocardiogram in 01/2018 showed EF 55-60% Cardiac MRI in 03/2018 with mildly dilated left ventricle with mild basal septal hypertrophy and low normal systolic function.  There is paradoxical septal motion, no LGE.  Overall findings consistent with nonischemic cardiomyopathy Interestingly, in 2022, patient had 3 EKGs that showed no evidence of LBBB History of prostate cancer   Patient was last seen by cardiology on 07/29/2021.  At that time, patient was in remission from prostate cancer.  His LBB had resolved.  Nonischemic cardiomyopathy had improved.  He did have some thickness in his LV wall, possible this had contributed to his LBBB.  Recommended continuing on low-dose losartan .  BP well-controlled  Today, patient reports that he has been doing very well from a cardiac standpoint.  He remains active by exercising on the [employer] and running.  Denies any chest pain or trouble breathing.  He denies dizziness, syncope, near syncope.  Denies lower extremity swelling.  He is followed by [facility] for his history of prostate cancer, currently in remission.  He also sees a metabolic oncologist and takes multiple vitamins and supplements.  He is wondering if he can stop losartan  given the improvement in his EF several years ago and his lack of ongoing cardiac issues.  His blood pressure has been  well-controlled, it is 106/70 today.   Studies Reviewed Cardiac Studies & Procedures   ______________________________________________________________________________________________   STRESS TESTS  MYOCARDIAL PERFUSION IMAGING 06/20/2016  Interpretation Summary  Nuclear stress EF: 38%.  No T wave inversion was noted during stress.  There was no ST segment deviation noted during stress.  Defect 1: There is a large defect of moderate severity.  This is an intermediate risk study.  Large size, moderate severity fixed inferoseptal perfusion defect - this could represent scar or LBBB-related artifact. No significant reversible ischemia. LVEF 38%, dilated LV with global hypokinesis. This is an intermediate risk study.   ECHOCARDIOGRAM  ECHOCARDIOGRAM COMPLETE 02/06/2018  Narrative *[facility]* [address] [place], [place] [address] [phone]  ------------------------------------------------------------------- Transthoracic Echocardiography  Patient:    Harold Jacobs, Harold Jacobs MR #:       [id number] Study Date: 02/06/2018 Gender:     M Age:        46 Height:     182.9 cm Weight:     97.5 kg BSA:        2.25 m^2 Pt. Status: Room:  SONOGRAPHER  Jon Hacker ATTENDING    Ezra Shuck, M.D. ORDERING     Vinie Maxcy MD REFERRING    Vinie Maxcy MD REFERRING    Nicholaus Norleen FALCON PERFORMING   [facility]  cc:  ------------------------------------------------------------------- LV EF: 55% -   60%  ------------------------------------------------------------------- Indications:      I42.9 Cardiomyopathy.  ------------------------------------------------------------------- History:   PMH:  Left bundle branch block.  Murmur.  ------------------------------------------------------------------- Study Conclusions  - Left ventricle: The cavity size was normal. There  was severe concentric hypertrophy. Systolic function was normal. The estimated ejection  fraction was in the range of 55% to 60%. Incoordinate septal motion. Doppler parameters are consistent with abnormal left ventricular relaxation (grade 1 diastolic dysfunction). The E/e&' ratio is between 8-15, suggesting indeterminate LV filling pressure. - Left atrium: The atrium was normal in size. - Inferior vena cava: The vessel was normal in size. The respirophasic diameter changes were in the normal range (>= 50%), consistent with normal central venous pressure.  Impressions:  - Compared to a prior study in 2018, the LVEF has improved to 55-60% with incoordinate septal motion.  ------------------------------------------------------------------- Study data:  Comparison was made to the study of 12/27/2016.  Study status:  Routine.  Procedure:  The patient reported no pain pre or post test. Transthoracic echocardiography. Image quality was adequate.  Study completion:  There were no complications. Transthoracic echocardiography.  M-mode, complete 2D, spectral Doppler, and color Doppler.  Birthdate:  Patient birthdate: 27-Jun-1971.  Age:  Patient is 53 yr old.  Sex:  Gender: male. BMI: 29.2 kg/m^2.  Blood pressure:     114/62  Patient status: Outpatient.  Study date:  Study date: 02/06/2018. Study time: 01:10 PM.  Location:  [facility]  -------------------------------------------------------------------  ------------------------------------------------------------------- Left ventricle:  The cavity size was normal. There was severe concentric hypertrophy. Systolic function was normal. The estimated ejection fraction was in the range of 55% to 60%. Incoordinate septal motion. Doppler parameters are consistent with abnormal left ventricular relaxation (grade 1 diastolic dysfunction). The E/e&' ratio is between 8-15, suggesting indeterminate LV filling pressure.  ------------------------------------------------------------------- Aortic valve:   Structurally normal valve.  Trileaflet. Cusp separation was normal.  Doppler:  Transvalvular velocity was within the normal range. There was no stenosis. There was no regurgitation.  ------------------------------------------------------------------- Aorta:  Aortic root: The aortic root was top normal in size. Ascending aorta: The ascending aorta was normal in size.  ------------------------------------------------------------------- Mitral valve:   Structurally normal valve.   Leaflet separation was normal.  Doppler:  Transvalvular velocity was within the normal range. There was no evidence for stenosis. There was no regurgitation.  ------------------------------------------------------------------- Left atrium:  The atrium was normal in size.  ------------------------------------------------------------------- Atrial septum:  No defect or patent foramen ovale was identified.  ------------------------------------------------------------------- Right ventricle:  The cavity size was normal. Wall thickness was normal. Systolic function was normal.  ------------------------------------------------------------------- Pulmonic valve:    The valve appears to be grossly normal. Doppler:  There was no significant regurgitation.  ------------------------------------------------------------------- Tricuspid valve:   Doppler:  There was no significant regurgitation.  ------------------------------------------------------------------- Pulmonary artery:   The main pulmonary artery was normal-sized.  ------------------------------------------------------------------- Right atrium:  The atrium was at the upper limits of normal in size.  ------------------------------------------------------------------- Pericardium:  There was no pericardial effusion.  ------------------------------------------------------------------- Systemic veins: Inferior vena cava: The vessel was normal in size. The respirophasic diameter  changes were in the normal range (>= 50%), consistent with normal central venous pressure.  ------------------------------------------------------------------- Measurements  Left ventricle                             Value        Reference LV ID, ED, PLAX chordal                    46.7  mm     43 - 52 LV ID, ES, PLAX chordal  30.9  mm     23 - 38 LV fx shortening, PLAX chordal             34    %      >=29 LV PW thickness, ED                        17.1  mm     ---------- IVS/LV PW ratio, ED                        1.01         <=1.3 LV end-diastolic volume, 1-p A2C           139   ml     ---------- LV end-systolic volume, 1-p A2C            59    ml     ---------- LV end-diastolic volume, 1-p A4C           199   ml     ---------- LV end-systolic volume, 1-p A4C            93    ml     ---------- LV ejection fraction, 1-p A4C              53    %      ---------- Stroke volume, 1-p A4C                     106   ml     ---------- LV end-diastolic volume/bsa, 1-p           89    ml/m^2 ---------- A4C LV end-systolic volume/bsa, 1-p            42    ml/m^2 ---------- A4C Stroke volume/bsa, 1-p A4C                 47    ml/m^2 ---------- LV end-diastolic volume, 2-p               165   ml     ---------- LV end-systolic volume, 2-p                75    ml     ---------- LV ejection fraction, 2-p                  55    %      ---------- Stroke volume, 2-p                         91    ml     ---------- LV end-diastolic volume/bsa, 2-p           74    ml/m^2 ---------- LV end-systolic volume/bsa, 2-p            33    ml/m^2 ---------- Stroke volume/bsa, 2-p                     40.4  ml/m^2 ---------- LV e&', lateral                             11.3  cm/s   ---------- LV E/e&', lateral  5.41         ---------- LV e&', medial                              5.99  cm/s   ---------- LV E/e&', medial                            10.2         ---------- LV  e&', average                             8.65  cm/s   ---------- LV E/e&', average                           7.07         ----------  Ventricular septum                         Value        Reference IVS thickness, ED                          17.2  mm     ----------  LVOT                                       Value        Reference LVOT ID, S                                 25    mm     ---------- LVOT area                                  4.91  cm^2   ----------  Aorta                                      Value        Reference Aortic root ID, ED                         38    mm     ----------  Left atrium                                Value        Reference LA ID, A-P, ES                             36    mm     ---------- LA ID/bsa, A-P                             1.6   cm/m^2 <=2.2 LA volume, S  72.6  ml     ---------- LA volume/bsa, S                           32.3  ml/m^2 ---------- LA volume, ES, 1-p A4C                     59    ml     ---------- LA volume/bsa, ES, 1-p A4C                 26.3  ml/m^2 ---------- LA volume, ES, 1-p A2C                     85.2  ml     ---------- LA volume/bsa, ES, 1-p A2C                 37.9  ml/m^2 ----------  Mitral valve                               Value        Reference Mitral E-wave peak velocity                61.1  cm/s   ---------- Mitral A-wave peak velocity                74.2  cm/s   ---------- Mitral deceleration time                   215   ms     150 - 230 Mitral E/A ratio, peak                     0.9          ----------  Right atrium                               Value        Reference RA ID, S-I, ES, A4C              (H)       54.1  mm     34 - 49 RA area, ES, A4C                           17.1  cm^2   8.3 - 19.5 RA volume, ES, A/L                         45.2  ml     ---------- RA volume/bsa, ES, A/L                     20.1  ml/m^2 ----------  Right ventricle                             Value        Reference RV s&', lateral, S                          11.6  cm/s   ----------  Legend: (L)  and  (H)  mark values outside specified reference range.  ------------------------------------------------------------------- Prepared and Electronically Authenticated by  Vinie Maxcy MD 2019-03-27T15:07:41  CARDIAC MRI  MR CARDIAC MORPHOLOGY W WO CONTRAST 04/04/2018  Narrative CLINICAL DATA:  53 year old male with h/o LBBB,nonischemic cardiomyopathy EF 45-50%, that improved to 55-60% and severe LVH on echocardiogram in March 2018.  EXAM: CARDIAC MRI  TECHNIQUE: The patient was scanned on a 1.5 Tesla GE magnet. A dedicated cardiac coil was used. Functional imaging was done using Fiesta sequences. 2,3, and 4 chamber views were done to assess for RWMA's. Modified Simpson's rule using a short axis stack was used to calculate an ejection fraction on a dedicated work [employer]. The patient received 28 cc of Multihance . After 10 minutes inversion recovery sequences were used to assess for infiltration and scar tissue.  CONTRAST:  28 cc  of Multihance   FINDINGS: 1. Mildly dilated left ventricle with mild basal septal hypertrophy (12 mm) and low normal systolic function (LVEF = 51%). There is paradoxical septal motion. There is no late gadolinium enhancement in the left ventricular myocardium.  LVEDD: 61 mm  LVESD: 44 mm  LVEDV: 220 ml  LVESV: 108 ml  SV: 112 ml  CO: 7.0 L/min  Myocardial mass: 157 g  2. Normal right ventricular size, thickness and systolic function (LVEF = 57%). There are no regional wall motion abnormalities.  3.  Normal left and right atrial size.  4. Normal size of the aortic root, ascending aorta and pulmonary artery.  5.  Mild mitral regurgitation.  6.  Normal pericardium.  No pericardial effusion.  IMPRESSION: 1. Mildly dilated left ventricle with mild basal septal hypertrophy (12 mm) and low normal  systolic function (LVEF = 51%). There is paradoxical septal motion. There is no late gadolinium enhancement in the left ventricular myocardium.  2. Normal right ventricular size, thickness and systolic function (LVEF = 57%). There are no regional wall motion abnormalities.  3. Normal left and right atrial size.  4. Mild mitral regurgitation.  Collectively, findings are consistent with non-ischemic cardiomyopathy with no evidence for infiltrative, inflammatory or hypertrophic cardiomyopathy.   Electronically Signed By: Leim Moose On: 04/04/2018 16:58   ______________________________________________________________________________________________        Risk Assessment/Calculations           Physical Exam VS:  BP 106/70 (BP Location: Right Arm, Patient Position: Sitting, Cuff Size: Normal)   Pulse 66   Ht 6' (1.829 m)   Wt 190 lb (86.2 kg)   BMI 25.77 kg/m        Wt Readings from Last 3 Encounters:  07/02/24 190 lb (86.2 kg)  07/29/21 197 lb 6.4 oz (89.5 kg)  04/01/20 212 lb (96.2 kg)    GEN: Well nourished, well developed in no acute distress. Sitting comfortably on the exam table  NECK: No JVD  CARDIAC:  RRR, no murmurs, rubs, gallops. Radial pulses 2+ bilaterally  RESPIRATORY:  Clear to auscultation without rales, wheezing or rhonchi. Normal WOB on room air   ABDOMEN: Soft, non-tender, non-distended EXTREMITIES:  No edema in BLE; No deformity   ASSESSMENT AND PLAN  Nonischemic cardiomyopathy  LBBB  - Previously had EF 45-50% in 2017.  By 01/2018, EF improved to 55-60%.  Cardiac MRI in 03/2018 consistent with nonischemic cardiomyopathy.  In the past, patient had persistent LBBB.  Had resolved in 2022 - Patient denies any chest pain, shortness of breath, orthopnea, leg swelling. Exercises by running and doing intense workouts on the [employer] without issue  - Patient takes multiple medications and supplements for his prostate cancer, would like to come off  losartan  to  reduce his pill burden  - BP well controlled, 106/70, and EF has recovered. No LBBB on EKG today. I agree it is reasonable to stop losartan   - Instructed patient to monitor BP. If BP becomes elevated above 130s systolic, he will likely need to resume BP medications. Patient voiced understanding and agreement   Prostate cancer - Followed by [facility] oncology, currently in remission    Dispo: Follow up with Dr. Mona as needed   Signed, Rollo FABIENE Louder, PA-C

## 2024-07-02 ENCOUNTER — Ambulatory Visit: Attending: Cardiology | Admitting: Cardiology

## 2024-07-02 ENCOUNTER — Encounter: Payer: Self-pay | Admitting: Cardiology

## 2024-07-02 VITALS — BP 106/70 | HR 66 | Ht 72.0 in | Wt 190.0 lb

## 2024-07-02 DIAGNOSIS — I517 Cardiomegaly: Secondary | ICD-10-CM

## 2024-07-02 DIAGNOSIS — I447 Left bundle-branch block, unspecified: Secondary | ICD-10-CM

## 2024-07-02 DIAGNOSIS — I428 Other cardiomyopathies: Secondary | ICD-10-CM | POA: Diagnosis not present
